# Patient Record
Sex: Male | Born: 1950 | Hispanic: No | Smoking: Current every day smoker
Health system: Southern US, Community
[De-identification: ages and names within clinical notes are randomized; demographics above are authoritative.]

## PROBLEM LIST (undated history)

## (undated) DIAGNOSIS — E781 Pure hyperglyceridemia: Secondary | ICD-10-CM

## (undated) DIAGNOSIS — G4700 Insomnia, unspecified: Secondary | ICD-10-CM

## (undated) DIAGNOSIS — E119 Type 2 diabetes mellitus without complications: Secondary | ICD-10-CM

## (undated) DIAGNOSIS — F1210 Cannabis abuse, uncomplicated: Secondary | ICD-10-CM

## (undated) DIAGNOSIS — F319 Bipolar disorder, unspecified: Secondary | ICD-10-CM

## (undated) DIAGNOSIS — F4310 Post-traumatic stress disorder, unspecified: Secondary | ICD-10-CM

## (undated) DIAGNOSIS — I10 Essential (primary) hypertension: Secondary | ICD-10-CM

## (undated) DIAGNOSIS — G8929 Other chronic pain: Secondary | ICD-10-CM

## (undated) DIAGNOSIS — IMO0002 Reserved for concepts with insufficient information to code with codable children: Secondary | ICD-10-CM

## (undated) DIAGNOSIS — F1110 Opioid abuse, uncomplicated: Secondary | ICD-10-CM

## (undated) HISTORY — PX: FOOT SURGERY: SHX648

---

## 2005-09-23 ENCOUNTER — Emergency Department (HOSPITAL_COMMUNITY): Admission: EM | Admit: 2005-09-23 | Discharge: 2005-09-23 | Payer: Self-pay | Admitting: Emergency Medicine

## 2006-01-22 ENCOUNTER — Ambulatory Visit: Payer: Self-pay | Admitting: Internal Medicine

## 2013-09-04 ENCOUNTER — Emergency Department (HOSPITAL_COMMUNITY)
Admission: EM | Admit: 2013-09-04 | Discharge: 2013-09-04 | Disposition: A | Discharge status: Home / Self Care | Payer: MEDICAID | Attending: Emergency Medicine | Admitting: Emergency Medicine

## 2013-09-04 ENCOUNTER — Encounter (HOSPITAL_COMMUNITY): Payer: Self-pay | Admitting: Emergency Medicine

## 2013-09-04 DIAGNOSIS — E119 Type 2 diabetes mellitus without complications: Secondary | ICD-10-CM | POA: Insufficient documentation

## 2013-09-04 DIAGNOSIS — F17200 Nicotine dependence, unspecified, uncomplicated: Secondary | ICD-10-CM | POA: Diagnosis not present

## 2013-09-04 DIAGNOSIS — R109 Unspecified abdominal pain: Secondary | ICD-10-CM | POA: Diagnosis not present

## 2013-09-04 DIAGNOSIS — R197 Diarrhea, unspecified: Secondary | ICD-10-CM | POA: Diagnosis not present

## 2013-09-04 DIAGNOSIS — I10 Essential (primary) hypertension: Secondary | ICD-10-CM | POA: Insufficient documentation

## 2013-09-04 DIAGNOSIS — R112 Nausea with vomiting, unspecified: Secondary | ICD-10-CM | POA: Insufficient documentation

## 2013-09-04 DIAGNOSIS — F1110 Opioid abuse, uncomplicated: Secondary | ICD-10-CM | POA: Diagnosis not present

## 2013-09-04 DIAGNOSIS — F19939 Other psychoactive substance use, unspecified with withdrawal, unspecified: Principal | ICD-10-CM | POA: Insufficient documentation

## 2013-09-04 DIAGNOSIS — F1123 Opioid dependence with withdrawal: Secondary | ICD-10-CM

## 2013-09-04 DIAGNOSIS — F1193 Opioid use, unspecified with withdrawal: Secondary | ICD-10-CM

## 2013-09-04 HISTORY — DX: Type 2 diabetes mellitus without complications: E11.9

## 2013-09-04 HISTORY — DX: Essential (primary) hypertension: I10

## 2013-09-04 LAB — BASIC METABOLIC PANEL
ANION GAP: 13 (ref 5–15)
BUN: 7 mg/dL (ref 6–23)
CO2: 27 meq/L (ref 19–32)
Calcium: 9.2 mg/dL (ref 8.4–10.5)
Chloride: 100 mEq/L (ref 96–112)
Creatinine, Ser: 0.67 mg/dL (ref 0.50–1.35)
GFR calc Af Amer: >90 mL/min (ref >90)
Glucose, Bld: 143 mg/dL — ABNORMAL HIGH (ref 70–99)
POTASSIUM: 3.7 meq/L (ref 3.7–5.3)
SODIUM: 140 meq/L (ref 137–147)

## 2013-09-04 LAB — CBC
HEMATOCRIT: 43.5 % (ref 39.0–52.0)
Hemoglobin: 14.3 g/dL (ref 13.0–17.0)
MCH: 30.6 pg (ref 26.0–34.0)
MCHC: 32.9 g/dL (ref 30.0–36.0)
MCV: 92.9 fL (ref 78.0–100.0)
PLATELETS: 257 10*3/uL (ref 150–400)
RBC: 4.68 MIL/uL (ref 4.22–5.81)
RDW: 12.6 % (ref 11.5–15.5)
WBC: 8.4 10*3/uL (ref 4.0–10.5)

## 2013-09-04 MED ORDER — CLONIDINE HCL 0.1 MG PO TABS: 0.1000 mg | ORAL_TABLET | Freq: Three times a day (TID) | ORAL | Status: DC

## 2013-09-04 MED ORDER — LOPERAMIDE HCL 2 MG PO CAPS: 2.0000 mg | ORAL_CAPSULE | Freq: Four times a day (QID) | ORAL | Status: DC | PRN

## 2013-09-04 MED ORDER — HYDROXYZINE HCL 25 MG PO TABS
25.0000 mg | ORAL_TABLET | Freq: Four times a day (QID) | ORAL | Status: DC | PRN
  Administered 2013-09-04: 25 mg via ORAL
  Filled 2013-09-04: qty 1

## 2013-09-04 MED ORDER — CLONIDINE HCL 0.1 MG PO TABS: 0.1000 mg | ORAL_TABLET | ORAL | Status: DC

## 2013-09-04 MED ORDER — METHOCARBAMOL 500 MG PO TABS: 1000.0000 mg | ORAL_TABLET | Freq: Four times a day (QID) | ORAL | Status: DC | PRN

## 2013-09-04 MED ORDER — DICYCLOMINE HCL 20 MG PO TABS: 20.0000 mg | ORAL_TABLET | Freq: Four times a day (QID) | ORAL | Status: DC | PRN

## 2013-09-04 MED ORDER — NAPROXEN 250 MG PO TABS
500.0000 mg | ORAL_TABLET | Freq: Two times a day (BID) | ORAL | Status: DC | PRN
Start: 2013-09-04 — End: 2013-09-04
  Administered 2013-09-04: 500 mg via ORAL
  Filled 2013-09-04: qty 2

## 2013-09-04 MED ORDER — ONDANSETRON 4 MG PO TBDP
4.0000 mg | ORAL_TABLET | Freq: Four times a day (QID) | ORAL | Status: DC | PRN
  Administered 2013-09-04: 4 mg via ORAL
  Filled 2013-09-04: qty 1

## 2013-09-04 MED ORDER — CLONIDINE HCL 0.1 MG PO TABS
0.1000 mg | ORAL_TABLET | Freq: Four times a day (QID) | ORAL | Status: DC
  Administered 2013-09-04 (×2): 0.1 mg via ORAL
  Filled 2013-09-04 (×2): qty 1

## 2013-09-04 MED ORDER — CLONIDINE HCL 0.1 MG PO TABS: 0.1000 mg | ORAL_TABLET | Freq: Every day | ORAL | Status: DC

## 2013-09-04 MED ORDER — HYDROXYZINE HCL 25 MG PO TABS: 25.0000 mg | ORAL_TABLET | Freq: Four times a day (QID) | ORAL | Status: DC | PRN

## 2013-09-04 MED ORDER — LOPERAMIDE HCL 2 MG PO CAPS: 2.0000 mg | ORAL_CAPSULE | ORAL | Status: DC | PRN

## 2013-09-04 MED ORDER — METHOCARBAMOL 500 MG PO TABS
500.0000 mg | ORAL_TABLET | Freq: Three times a day (TID) | ORAL | Status: DC | PRN
Start: 2013-09-04 — End: 2013-09-04
  Administered 2013-09-04: 500 mg via ORAL
  Filled 2013-09-04: qty 1

## 2013-09-04 NOTE — ED Provider Notes (Signed)
CSN: [id number]     Arrival date & time 09/04/13  1006 History   First MD Initiated Contact with Patient 09/04/13 1016     Chief Complaint  Patient presents with  . Withdrawal     (Consider location/radiation/quality/duration/timing/severity/associated sxs/prior Treatment) HPI Comments: Patient with history of heroin use, marijuana use, diabetes -- presents with complaint of withdrawal. Patient uses heroin twice a day. Yesterday he decided to stop because he wants to quit. Patient presents today with withdrawals. He complains of abdominal cramping, vomiting, diarrhea. No treatments prior to arrival. Patient denies fevers, chills, cold symptoms, chest pain, shortness of breath. No urinary symptoms or skin rash. Patient states that he has not been checking his blood sugars. The onset of this condition was acute. The course is constant. Aggravating factors: none. Alleviating factors: none.    The history is provided by the patient.    Past Medical History  Diagnosis Date  . Diabetes mellitus without complication   . Hypertension    Past Surgical History  Procedure Laterality Date  . Foot surgery     History reviewed. No pertinent family history. History  Substance Use Topics  . Smoking status: Current Every Day Smoker -- 1.00 packs/day  . Smokeless tobacco: Not on file  . Alcohol Use: No    Review of Systems  Constitutional: Negative for fever.  HENT: Negative for rhinorrhea and sore throat.   Eyes: Negative for redness.  Respiratory: Negative for cough.   Cardiovascular: Negative for chest pain and leg swelling.  Gastrointestinal: Positive for nausea, vomiting, abdominal pain and diarrhea. Negative for constipation.  Genitourinary: Negative for dysuria.  Musculoskeletal: Negative for myalgias.  Skin: Negative for rash.  Neurological: Negative for headaches.      Allergies  Review of patient's allergies indicates no known allergies.  Home Medications   Prior to  Admission medications   Not on File   BP 194/84  Pulse 79  Temp(Src) 97.9 F (36.6 C) (Oral)  Resp 24  SpO2 100%  Physical Exam  Nursing note and vitals reviewed. Constitutional: He appears well-developed and well-nourished.  HENT:  Head: Normocephalic and atraumatic.  Eyes: Conjunctivae are normal. Right eye exhibits no discharge. Left eye exhibits no discharge.  Neck: Normal range of motion. Neck supple.  Cardiovascular: Normal rate, regular rhythm and normal heart sounds.   No murmur heard. Pulmonary/Chest: Effort normal and breath sounds normal. No respiratory distress. He has no wheezes. He has no rales.  Abdominal: Soft. There is tenderness (generalized). There is no rebound and no guarding.  Neurological: He is alert.  Skin: Skin is warm and dry.  Psychiatric: He has a normal mood and affect.    ED Course  Procedures (including critical care time) Labs Review Labs Reviewed  BASIC METABOLIC PANEL - Abnormal; Notable for the following:    Glucose, Bld 143 (*)    All other components within normal limits  CBC    Imaging Review No results found.   EKG Interpretation None      10:24 AM Patient seen and examined. Work-up initiated. Medications ordered.   Vital signs reviewed and are as follows: BP 194/84  Pulse 79  Temp(Src) 97.9 F (36.6 C) (Oral)  Resp 24  SpO2 100%  11:20 AM Spoke with TTS who has faxed over resources.   1:51 PM Patient stable. Labs unconcerning for complications related to DM. Will d/c to home with resources and medication for symptoms. Patient verbalizes understanding and agrees with plan.   BP 169/81  Pulse 71  Temp(Src) 97.9 F (36.6 C) (Oral)  Resp 20  SpO2 96%   MDM   Final diagnoses:  Opiate withdrawal   Pt with signs and symptoms consistent with opiate withdrawal. Patient treated with symptomatic management while in the ED. I spoke with TTS who has faxed resources and I have given these to the patient. No  life-threatening emergencies identified. No complications of diabetes including DKA.    Renne Crigler, PA-C 09/04/13 1353

## 2013-09-04 NOTE — Discharge Instructions (Signed)
Please read and follow all provided instructions.  Your diagnoses today include:  1. Opiate withdrawal     Tests performed today include:  Blood counts and electrolytes - no concerning findings  Vital signs. See below for your results today.   Medications prescribed:   Robaxin (methocarbamol) - muscle relaxer medication  DO NOT drive or perform any activities that require you to be awake and alert because this medicine can make you drowsy.    Hydroxyzine - antihistamine  You can find this medication over-the-counter.   This medication will make you drowsy. DO NOT drive or perform any activities that require you to be awake and alert if taking this.  Take any prescribed medications only as directed.  Home care instructions:  Follow any educational materials contained in this packet.  Follow-up instructions: Contact the referrals given to you for help with heroin detox.   Return instructions:   Please return to the Emergency Department if you experience worsening symptoms.   Please return if you have any other emergent concerns.  Additional Information:  Your vital signs today were: BP 179/74   Pulse 74   Temp(Src) 97.9 F (36.6 C) (Oral)   Resp 20   SpO2 98% If your blood pressure (BP) was elevated above 135/85 this visit, please have this repeated by your doctor within one month. --------------

## 2013-09-04 NOTE — ED Notes (Signed)
Per [facility], pt is IV heroin user, he generally uses twice per day but has not used since yesterday morning. Pt has been feeling "sick" pt reports n/v and multiple bowel movements over night. Hypertensive at 190/100 with EMS No acute distress noted on arrival.

## 2013-09-04 NOTE — ED Provider Notes (Signed)
Medical screening examination/treatment/procedure(s) were performed by non-physician practitioner and as supervising physician I was immediately available for consultation/collaboration.   EKG Interpretation None        Layla MawKristen N Dailey Alberson, DO 09/04/13 1528

## 2013-09-13 ENCOUNTER — Emergency Department (HOSPITAL_COMMUNITY): Payer: MEDICAID

## 2013-09-13 ENCOUNTER — Emergency Department (HOSPITAL_COMMUNITY)
Admission: EM | Admit: 2013-09-13 | Discharge: 2013-09-14 | Disposition: A | Discharge status: Other Acute Inpatient Hospital | Payer: MEDICAID | Attending: Emergency Medicine | Admitting: Emergency Medicine

## 2013-09-13 ENCOUNTER — Encounter (HOSPITAL_COMMUNITY): Payer: Self-pay | Admitting: Emergency Medicine

## 2013-09-13 DIAGNOSIS — I861 Scrotal varices: Secondary | ICD-10-CM

## 2013-09-13 DIAGNOSIS — Z794 Long term (current) use of insulin: Secondary | ICD-10-CM | POA: Diagnosis not present

## 2013-09-13 DIAGNOSIS — F3289 Other specified depressive episodes: Secondary | ICD-10-CM | POA: Diagnosis not present

## 2013-09-13 DIAGNOSIS — R197 Diarrhea, unspecified: Secondary | ICD-10-CM | POA: Diagnosis not present

## 2013-09-13 DIAGNOSIS — Z008 Encounter for other general examination: Secondary | ICD-10-CM | POA: Insufficient documentation

## 2013-09-13 DIAGNOSIS — R079 Chest pain, unspecified: Secondary | ICD-10-CM | POA: Diagnosis not present

## 2013-09-13 DIAGNOSIS — R109 Unspecified abdominal pain: Secondary | ICD-10-CM | POA: Insufficient documentation

## 2013-09-13 DIAGNOSIS — E119 Type 2 diabetes mellitus without complications: Secondary | ICD-10-CM | POA: Diagnosis not present

## 2013-09-13 DIAGNOSIS — F1120 Opioid dependence, uncomplicated: Secondary | ICD-10-CM | POA: Diagnosis not present

## 2013-09-13 DIAGNOSIS — F1110 Opioid abuse, uncomplicated: Secondary | ICD-10-CM

## 2013-09-13 DIAGNOSIS — G319 Degenerative disease of nervous system, unspecified: Secondary | ICD-10-CM | POA: Insufficient documentation

## 2013-09-13 DIAGNOSIS — F4310 Post-traumatic stress disorder, unspecified: Secondary | ICD-10-CM | POA: Diagnosis not present

## 2013-09-13 DIAGNOSIS — Z79899 Other long term (current) drug therapy: Secondary | ICD-10-CM | POA: Diagnosis not present

## 2013-09-13 DIAGNOSIS — F329 Major depressive disorder, single episode, unspecified: Secondary | ICD-10-CM | POA: Diagnosis not present

## 2013-09-13 DIAGNOSIS — I10 Essential (primary) hypertension: Secondary | ICD-10-CM

## 2013-09-13 DIAGNOSIS — F17200 Nicotine dependence, unspecified, uncomplicated: Secondary | ICD-10-CM | POA: Insufficient documentation

## 2013-09-13 DIAGNOSIS — R739 Hyperglycemia, unspecified: Secondary | ICD-10-CM

## 2013-09-13 HISTORY — DX: Cannabis abuse, uncomplicated: F12.10

## 2013-09-13 HISTORY — DX: Opioid abuse, uncomplicated: F11.10

## 2013-09-13 HISTORY — DX: Insomnia, unspecified: G47.00

## 2013-09-13 HISTORY — DX: Bipolar disorder, unspecified: F31.9

## 2013-09-13 HISTORY — DX: Reserved for concepts with insufficient information to code with codable children: IMO0002

## 2013-09-13 HISTORY — DX: Pure hyperglyceridemia: E78.1

## 2013-09-13 HISTORY — DX: Other chronic pain: G89.29

## 2013-09-13 HISTORY — DX: Post-traumatic stress disorder, unspecified: F43.10

## 2013-09-13 LAB — COMPREHENSIVE METABOLIC PANEL
ALBUMIN: 3.7 g/dL (ref 3.5–5.2)
ALT: 20 U/L (ref 0–53)
AST: 24 U/L (ref 0–37)
Alkaline Phosphatase: 59 U/L (ref 39–117)
Anion gap: 11 (ref 5–15)
BILIRUBIN TOTAL: 0.7 mg/dL (ref 0.3–1.2)
BUN: 6 mg/dL (ref 6–23)
CO2: 28 meq/L (ref 19–32)
CREATININE: 0.69 mg/dL (ref 0.50–1.35)
Calcium: 9.3 mg/dL (ref 8.4–10.5)
Chloride: 98 mEq/L (ref 96–112)
GFR calc Af Amer: >90 mL/min (ref >90)
GFR calc non Af Amer: >90 mL/min (ref >90)
Glucose, Bld: 148 mg/dL — ABNORMAL HIGH (ref 70–99)
Potassium: 3.4 mEq/L — ABNORMAL LOW (ref 3.7–5.3)
Sodium: 137 mEq/L (ref 137–147)
Total Protein: 7.1 g/dL (ref 6.0–8.3)

## 2013-09-13 LAB — I-STAT TROPONIN, ED
TROPONIN I, POC: 0.05 ng/mL (ref 0.00–0.08)
Troponin i, poc: 0.01 ng/mL (ref 0.00–0.08)

## 2013-09-13 LAB — CBC WITH DIFFERENTIAL/PLATELET
BASOS PCT: 0 % (ref 0–1)
Basophils Absolute: 0 10*3/uL (ref 0.0–0.1)
Eosinophils Absolute: 0.2 10*3/uL (ref 0.0–0.7)
Eosinophils Relative: 2 % (ref 0–5)
HEMATOCRIT: 41.7 % (ref 39.0–52.0)
HEMOGLOBIN: 13.9 g/dL (ref 13.0–17.0)
Lymphocytes Relative: 41 % (ref 12–46)
Lymphs Abs: 3.5 10*3/uL (ref 0.7–4.0)
MCH: 30.6 pg (ref 26.0–34.0)
MCHC: 33.3 g/dL (ref 30.0–36.0)
MCV: 91.9 fL (ref 78.0–100.0)
MONO ABS: 0.4 10*3/uL (ref 0.1–1.0)
MONOS PCT: 5 % (ref 3–12)
Neutro Abs: 4.3 10*3/uL (ref 1.7–7.7)
Neutrophils Relative %: 52 % (ref 43–77)
Platelets: 278 10*3/uL (ref 150–400)
RBC: 4.54 MIL/uL (ref 4.22–5.81)
RDW: 12.3 % (ref 11.5–15.5)
WBC: 8.4 10*3/uL (ref 4.0–10.5)

## 2013-09-13 LAB — RAPID URINE DRUG SCREEN, HOSP PERFORMED
Amphetamines: NOT DETECTED
BARBITURATES: NOT DETECTED
Benzodiazepines: NOT DETECTED
Cocaine: NOT DETECTED
Opiates: POSITIVE — AB
Tetrahydrocannabinol: POSITIVE — AB

## 2013-09-13 LAB — CBG MONITORING, ED: GLUCOSE-CAPILLARY: 125 mg/dL — AB (ref 70–99)

## 2013-09-13 LAB — ETHANOL

## 2013-09-13 MED ORDER — LORAZEPAM 1 MG PO TABS
1.0000 mg | ORAL_TABLET | Freq: Three times a day (TID) | ORAL | Status: DC | PRN
  Administered 2013-09-13: 1 mg via ORAL
  Filled 2013-09-13: qty 1

## 2013-09-13 MED ORDER — IBUPROFEN 400 MG PO TABS: 600.0000 mg | ORAL_TABLET | Freq: Three times a day (TID) | ORAL | Status: DC | PRN

## 2013-09-13 MED ORDER — NAPROXEN 250 MG PO TABS: 500.0000 mg | ORAL_TABLET | Freq: Two times a day (BID) | ORAL | Status: DC | PRN

## 2013-09-13 MED ORDER — CLONIDINE HCL 0.1 MG PO TABS: 0.1000 mg | ORAL_TABLET | ORAL | Status: DC

## 2013-09-13 MED ORDER — CLONIDINE HCL 0.1 MG PO TABS
0.1000 mg | ORAL_TABLET | Freq: Four times a day (QID) | ORAL | Status: DC
  Administered 2013-09-13 – 2013-09-14 (×6): 0.1 mg via ORAL
  Filled 2013-09-13 (×6): qty 1

## 2013-09-13 MED ORDER — DICYCLOMINE HCL 20 MG PO TABS
20.0000 mg | ORAL_TABLET | Freq: Four times a day (QID) | ORAL | Status: DC | PRN
  Administered 2013-09-13 – 2013-09-14 (×3): 20 mg via ORAL
  Filled 2013-09-13 (×3): qty 1

## 2013-09-13 MED ORDER — HYDROXYZINE HCL 25 MG PO TABS
25.0000 mg | ORAL_TABLET | Freq: Four times a day (QID) | ORAL | Status: DC | PRN
  Administered 2013-09-13 – 2013-09-14 (×2): 25 mg via ORAL
  Filled 2013-09-13 (×2): qty 1

## 2013-09-13 MED ORDER — CLONIDINE HCL 0.1 MG PO TABS: 0.1000 mg | ORAL_TABLET | Freq: Every day | ORAL | Status: DC

## 2013-09-13 MED ORDER — ONDANSETRON HCL 4 MG PO TABS: 4.0000 mg | ORAL_TABLET | Freq: Three times a day (TID) | ORAL | Status: DC | PRN

## 2013-09-13 MED ORDER — LOPERAMIDE HCL 2 MG PO CAPS
2.0000 mg | ORAL_CAPSULE | ORAL | Status: DC | PRN
  Administered 2013-09-14: 2 mg via ORAL
  Filled 2013-09-13: qty 1

## 2013-09-13 MED ORDER — METHOCARBAMOL 500 MG PO TABS
500.0000 mg | ORAL_TABLET | Freq: Three times a day (TID) | ORAL | Status: DC | PRN
  Administered 2013-09-13: 500 mg via ORAL
  Filled 2013-09-13: qty 1

## 2013-09-13 MED ORDER — ONDANSETRON 4 MG PO TBDP
4.0000 mg | ORAL_TABLET | Freq: Four times a day (QID) | ORAL | Status: DC | PRN
  Filled 2013-09-13: qty 1

## 2013-09-13 MED ORDER — ACETAMINOPHEN 325 MG PO TABS
650.0000 mg | ORAL_TABLET | ORAL | Status: DC | PRN
  Administered 2013-09-13: 650 mg via ORAL
  Filled 2013-09-13: qty 2

## 2013-09-13 MED ORDER — ALUM & MAG HYDROXIDE-SIMETH 200-200-20 MG/5ML PO SUSP: 30.0000 mL | ORAL | Status: DC | PRN

## 2013-09-13 MED ORDER — NICOTINE 21 MG/24HR TD PT24
21.0000 mg | MEDICATED_PATCH | Freq: Every day | TRANSDERMAL | Status: DC
Start: 2013-09-13 — End: 2013-09-14
  Administered 2013-09-13 – 2013-09-14 (×2): 21 mg via TRANSDERMAL
  Filled 2013-09-13 (×3): qty 1

## 2013-09-13 NOTE — ED Notes (Signed)
PA at bedside.

## 2013-09-13 NOTE — ED Provider Notes (Signed)
Testicular US shows varicocele Awaiting placement   Henry Gaskinsonald W Ketty Bitton, MD 09/13/13 [id number]
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
## 2013-09-13 NOTE — Progress Notes (Signed)
[place] the patient would have to pay for hospitalization out of pocket because he has adult medicaid.

## 2013-09-13 NOTE — ED Notes (Signed)
Patient arrived via [facility] with heroin withdrawal. Patient stopped using heroin 2 days ago and is currently having left sided chest pain, generalized weakness, shortness of breath. No EKG or IV prior to arrival. Patient is hypertensive and states he has not had his BP medication in about a week. A/O at this time.

## 2013-09-13 NOTE — Progress Notes (Signed)
[facility]  Writer informed the nurse Renae Fickle(Paul) of the patients disposition.

## 2013-09-13 NOTE — ED Notes (Signed)
Pt. Reported "OOH ,my testicles hurt."    Informed Josh,Geiple, PA , no orders received.

## 2013-09-13 NOTE — ED Notes (Signed)
Pt. Asked to go outside. Explained to pt. That he is unable to go outside.  Offered him a Nicotine patch.  Pt. Refused.  Pt. Denies any alcohol comsumption.  Last time pt. Used heroine was yesterday.

## 2013-09-13 NOTE — ED Notes (Signed)
Pt moved to rm C21-- oriented to rm and area. Sitter at bedside. Pt affect flat, staring at staff, using one word answers.

## 2013-09-13 NOTE — ED Notes (Signed)
Pt.  Changed in Scrubs and  Will be wanded at the bedside in Pod C 20.  Belongings given to Clydie BraunKaren, RCharity fundraiser

## 2013-09-13 NOTE — ED Provider Notes (Signed)
CSN: [id number]     Arrival date & time 09/13/13  0555 History   First MD Initiated Contact with Patient 09/13/13 0600     Chief Complaint  Patient presents with  . Addiction Problem     (Consider location/radiation/quality/duration/timing/severity/associated sxs/prior Treatment) HPI Comments: Patient with h/o DM, marijuana use, heroin use -- presents with c/o withdrawal and chest pain. Last heroin use 2 days ago. Injects into stomach. Denies EtOH and cocaine. Intermittent, non-radiating L lower CP x 2 days. No SOB. Also c/o abdominal pain, N/V/D. No fever, urinary sx, skin rashes or swelling. No treatments PTA. States he followed-up with referrals given last time and states no one could help him because he is a [employer]ident. He has not had chronic medications in past week. The onset of this condition was acute. The course is constant. Aggravating factors: none. Alleviating factors: none.    The history is provided by the patient and medical records.    Past Medical History  Diagnosis Date  . Diabetes mellitus without complication   . Hypertension   . PTSD (post-traumatic stress disorder)    Past Surgical History  Procedure Laterality Date  . Foot surgery     No family history on file. History  Substance Use Topics  . Smoking status: Current Every Day Smoker -- 1.00 packs/day  . Smokeless tobacco: Not on file  . Alcohol Use: No    Review of Systems  Constitutional: Negative for fever.  HENT: Negative for rhinorrhea and sore throat.   Eyes: Negative for redness.  Respiratory: Negative for cough and shortness of breath.   Cardiovascular: Positive for chest pain. Negative for palpitations and leg swelling.  Gastrointestinal: Positive for nausea, vomiting, abdominal pain and diarrhea.  Genitourinary: Negative for dysuria.  Musculoskeletal: Negative for myalgias.  Skin: Negative for rash.  Neurological: Negative for headaches.   Allergies  Review of patient's  allergies indicates no known allergies.  Home Medications   Prior to Admission medications   Medication Sig Start Date End Date Taking? Authorizing Provider  amitriptyline (ELAVIL) 50 MG tablet Take 50 mg by mouth at bedtime.    Historical Provider, MD  cloNIDine (CATAPRES) 0.1 MG tablet Take 1 tablet (0.1 mg total) by mouth 3 (three) times daily. 09/04/13   Renne CriglerJoshua Ovida Delagarza, PA-C  hydrOXYzine (ATARAX/VISTARIL) 25 MG tablet Take 1 tablet (25 mg total) by mouth every 6 (six) hours as needed for anxiety. 09/04/13   Renne CriglerJoshua Seanmichael Salmons, PA-C  insulin aspart (NOVOLOG) 100 UNIT/ML injection Inject 8 Units into the skin 3 (three) times daily before meals.    Historical Provider, MD  insulin glargine (LANTUS) 100 UNIT/ML injection Inject 60 Units into the skin at bedtime.    Historical Provider, MD  loperamide (IMODIUM) 2 MG capsule Take 1 capsule (2 mg total) by mouth 4 (four) times daily as needed for diarrhea or loose stools. 09/04/13   Renne CriglerJoshua Fantasy Donald, PA-C  methocarbamol (ROBAXIN) 500 MG tablet Take 2 tablets (1,000 mg total) by mouth every 6 (six) hours as needed for muscle spasms. 09/04/13   Renne CriglerJoshua Kamry Faraci, PA-C  QUEtiapine (SEROQUEL) 300 MG tablet Take 600 mg by mouth at bedtime.    Historical Provider, MD   BP 190/79  Pulse 61  Temp(Src) 98.2 F (36.8 C) (Oral)  Resp 15  Ht 6\' 2"  (1.88 m)  Wt 190 lb (86.183 kg)  BMI 24.38 kg/m2  SpO2 100%  Physical Exam  Nursing note and vitals reviewed. Constitutional: He appears well-developed and well-nourished.  HENT:  Head: Normocephalic and atraumatic.  Eyes: Conjunctivae are normal. Right eye exhibits no discharge. Left eye exhibits no discharge.  Neck: Normal range of motion. Neck supple.  Cardiovascular: Normal rate, regular rhythm and normal heart sounds.   Pulmonary/Chest: Effort normal and breath sounds normal.  Abdominal: Soft. There is no tenderness.  Neurological: He is alert.  Skin: Skin is warm and dry.  Psychiatric: He has a normal mood and  affect.    ED Course  Procedures (including critical care time) Labs Review Labs Reviewed  COMPREHENSIVE METABOLIC PANEL - Abnormal; Notable for the following:    Potassium 3.4 (*)    Glucose, Bld 148 (*)    All other components within normal limits  URINE RAPID DRUG SCREEN (HOSP PERFORMED) - Abnormal; Notable for the following:    Opiates POSITIVE (*)    Tetrahydrocannabinol POSITIVE (*)    All other components within normal limits  CBC WITH DIFFERENTIAL  ETHANOL  I-STAT TROPOININ, ED  Rosezena Sensor, ED    Imaging Review Dg Chest 2 View  09/13/2013   CLINICAL DATA:  Chest pain and difficulty breathing  EXAM: CHEST  2 VIEW  COMPARISON:  None.  FINDINGS: Lungs are clear. Heart size and pulmonary vascularity are normal. No adenopathy. No pneumothorax. No bone lesions.  IMPRESSION: No edema or consolidation.   Electronically Signed   By: Bretta Bang M.D.   On: 09/13/2013 07:01     EKG Interpretation   Date/Time:  Monday September 13 2013 06:11:05 EDT Ventricular Rate:  71 PR Interval:  205 QRS Duration: 105 QT Interval:  478 QTC Calculation: 519 R Axis:   128 Text Interpretation:  Sinus rhythm Ventricular premature complex Consider  left ventricular hypertrophy Inferior infarct, old Prolonged QT interval  Confirmed by Rhunette Croft, MD, Janey Genta [phone]) on 09/13/2013 6:16:30 AM      6:16 AM Patient seen and examined. Work-up initiated. Medications ordered.   Vital signs reviewed and are as follows: BP 190/79  Pulse 61  Temp(Src) 98.2 F (36.8 C) (Oral)  Resp 15  Ht 6\' 2"  (1.88 m)  Wt 190 lb (86.183 kg)  BMI 24.38 kg/m2  SpO2 100%  9:48 AM 2nd marker neg. Pt discussed with Dr. Patria Mane. TTS has evaluated. Feels he needs inpatient treatment. He does endorse depression and suicidality. Will move to Pod C.   2:02 PM Continues to hold while placement is found. Psych requests head CT and CXR to facilitate placement.   3:06 PM Called to check on patient. He is having  testicular tenderness. Will order ultrasound to eval for epididymitis.    MDM   Final diagnoses:  Heroin abuse  Hyperglycemia without ketosis   Pending placement.     Renne Crigler, PA-C 09/13/13 [address], PA-C 09/13/13 [phone]

## 2013-09-13 NOTE — ED Notes (Signed)
Pt. Given coffee.  Pt. Does not want anything to eat presently.   Set up TTS at the bedside.

## 2013-09-14 ENCOUNTER — Encounter (HOSPITAL_COMMUNITY): Payer: Self-pay | Admitting: General Practice

## 2013-09-14 ENCOUNTER — Inpatient Hospital Stay (HOSPITAL_COMMUNITY)
Admission: AD | Admit: 2013-09-14 | Discharge: 2013-09-20 | DRG: 897 | Disposition: A | Discharge status: Home / Self Care | Payer: MEDICAID | Source: Intra-hospital | Attending: Psychiatry | Admitting: Psychiatry

## 2013-09-14 ENCOUNTER — Encounter (HOSPITAL_COMMUNITY): Payer: Self-pay | Admitting: Emergency Medicine

## 2013-09-14 DIAGNOSIS — I10 Essential (primary) hypertension: Secondary | ICD-10-CM | POA: Diagnosis present

## 2013-09-14 DIAGNOSIS — F1210 Cannabis abuse, uncomplicated: Secondary | ICD-10-CM | POA: Diagnosis present

## 2013-09-14 DIAGNOSIS — G8929 Other chronic pain: Secondary | ICD-10-CM | POA: Diagnosis present

## 2013-09-14 DIAGNOSIS — F4310 Post-traumatic stress disorder, unspecified: Secondary | ICD-10-CM | POA: Diagnosis present

## 2013-09-14 DIAGNOSIS — F1120 Opioid dependence, uncomplicated: Secondary | ICD-10-CM | POA: Diagnosis present

## 2013-09-14 DIAGNOSIS — Z598 Other problems related to housing and economic circumstances: Secondary | ICD-10-CM | POA: Diagnosis not present

## 2013-09-14 DIAGNOSIS — G4700 Insomnia, unspecified: Secondary | ICD-10-CM | POA: Diagnosis present

## 2013-09-14 DIAGNOSIS — E119 Type 2 diabetes mellitus without complications: Secondary | ICD-10-CM | POA: Diagnosis present

## 2013-09-14 DIAGNOSIS — F319 Bipolar disorder, unspecified: Secondary | ICD-10-CM | POA: Diagnosis present

## 2013-09-14 DIAGNOSIS — M549 Dorsalgia, unspecified: Secondary | ICD-10-CM | POA: Diagnosis present

## 2013-09-14 DIAGNOSIS — Z5987 Material hardship due to limited financial resources, not elsewhere classified: Secondary | ICD-10-CM

## 2013-09-14 DIAGNOSIS — R45851 Suicidal ideations: Secondary | ICD-10-CM | POA: Diagnosis not present

## 2013-09-14 DIAGNOSIS — IMO0001 Reserved for inherently not codable concepts without codable children: Secondary | ICD-10-CM | POA: Diagnosis present

## 2013-09-14 DIAGNOSIS — F17200 Nicotine dependence, unspecified, uncomplicated: Secondary | ICD-10-CM | POA: Diagnosis present

## 2013-09-14 DIAGNOSIS — F411 Generalized anxiety disorder: Secondary | ICD-10-CM | POA: Diagnosis present

## 2013-09-14 DIAGNOSIS — F1994 Other psychoactive substance use, unspecified with psychoactive substance-induced mood disorder: Secondary | ICD-10-CM | POA: Diagnosis present

## 2013-09-14 DIAGNOSIS — Z5989 Other problems related to housing and economic circumstances: Secondary | ICD-10-CM | POA: Diagnosis not present

## 2013-09-14 DIAGNOSIS — F329 Major depressive disorder, single episode, unspecified: Secondary | ICD-10-CM | POA: Diagnosis present

## 2013-09-14 DIAGNOSIS — F1124 Opioid dependence with opioid-induced mood disorder: Secondary | ICD-10-CM

## 2013-09-14 LAB — GLUCOSE, CAPILLARY
GLUCOSE-CAPILLARY: 113 mg/dL — AB (ref 70–99)
Glucose-Capillary: 174 mg/dL — ABNORMAL HIGH (ref 70–99)

## 2013-09-14 LAB — CBG MONITORING, ED
GLUCOSE-CAPILLARY: 156 mg/dL — AB (ref 70–99)
Glucose-Capillary: 122 mg/dL — ABNORMAL HIGH (ref 70–99)

## 2013-09-14 MED ORDER — METHOCARBAMOL 500 MG PO TABS
500.0000 mg | ORAL_TABLET | Freq: Three times a day (TID) | ORAL | Status: AC | PRN
  Administered 2013-09-14 – 2013-09-18 (×6): 500 mg via ORAL
  Filled 2013-09-14 (×6): qty 1

## 2013-09-14 MED ORDER — LOPERAMIDE HCL 2 MG PO CAPS: 2.0000 mg | ORAL_CAPSULE | ORAL | Status: AC | PRN

## 2013-09-14 MED ORDER — ALUM & MAG HYDROXIDE-SIMETH 200-200-20 MG/5ML PO SUSP: 30.0000 mL | ORAL | Status: DC | PRN

## 2013-09-14 MED ORDER — QUETIAPINE FUMARATE 400 MG PO TABS
400.0000 mg | ORAL_TABLET | Freq: Every day | ORAL | Status: DC
  Administered 2013-09-14: 400 mg via ORAL
  Filled 2013-09-14: qty 1
  Filled 2013-09-14: qty 2
  Filled 2013-09-14: qty 1

## 2013-09-14 MED ORDER — LISINOPRIL-HYDROCHLOROTHIAZIDE 20-25 MG PO TABS: 1.0000 | ORAL_TABLET | Freq: Every day | ORAL | Status: DC

## 2013-09-14 MED ORDER — SERTRALINE HCL 100 MG PO TABS
200.0000 mg | ORAL_TABLET | Freq: Every day | ORAL | Status: DC
  Administered 2013-09-15 – 2013-09-20 (×6): 200 mg via ORAL
  Filled 2013-09-14: qty 14
  Filled 2013-09-14 (×6): qty 2
  Filled 2013-09-14: qty 4
  Filled 2013-09-14: qty 2

## 2013-09-14 MED ORDER — LISINOPRIL 20 MG PO TABS
20.0000 mg | ORAL_TABLET | Freq: Once | ORAL | Status: AC
  Administered 2013-09-14: 20 mg via ORAL
  Filled 2013-09-14: qty 1

## 2013-09-14 MED ORDER — CLONIDINE HCL 0.1 MG PO TABS
0.1000 mg | ORAL_TABLET | ORAL | Status: AC
  Administered 2013-09-17 – 2013-09-18 (×4): 0.1 mg via ORAL
  Filled 2013-09-14 (×4): qty 1

## 2013-09-14 MED ORDER — NAPROXEN 500 MG PO TABS
500.0000 mg | ORAL_TABLET | Freq: Two times a day (BID) | ORAL | Status: AC | PRN
  Administered 2013-09-15 – 2013-09-18 (×3): 500 mg via ORAL
  Filled 2013-09-14 (×3): qty 1

## 2013-09-14 MED ORDER — MAGNESIUM HYDROXIDE 400 MG/5ML PO SUSP: 30.0000 mL | Freq: Every day | ORAL | Status: DC | PRN

## 2013-09-14 MED ORDER — HYDROCHLOROTHIAZIDE 25 MG PO TABS
25.0000 mg | ORAL_TABLET | Freq: Every day | ORAL | Status: DC
  Administered 2013-09-15 – 2013-09-20 (×6): 25 mg via ORAL
  Filled 2013-09-14 (×9): qty 1

## 2013-09-14 MED ORDER — ACETAMINOPHEN 325 MG PO TABS
650.0000 mg | ORAL_TABLET | Freq: Four times a day (QID) | ORAL | Status: DC | PRN
  Administered 2013-09-17 – 2013-09-20 (×2): 650 mg via ORAL
  Filled 2013-09-14 (×2): qty 2

## 2013-09-14 MED ORDER — DICYCLOMINE HCL 20 MG PO TABS
20.0000 mg | ORAL_TABLET | Freq: Four times a day (QID) | ORAL | Status: AC | PRN
  Administered 2013-09-14 – 2013-09-15 (×2): 20 mg via ORAL
  Filled 2013-09-14 (×2): qty 1

## 2013-09-14 MED ORDER — QUETIAPINE FUMARATE 200 MG PO TABS: 600.0000 mg | ORAL_TABLET | Freq: Every day | ORAL | Status: DC

## 2013-09-14 MED ORDER — INSULIN ASPART 100 UNIT/ML ~~LOC~~ SOLN
5.0000 [IU] | Freq: Three times a day (TID) | SUBCUTANEOUS | Status: DC
  Administered 2013-09-14 – 2013-09-17 (×5): 5 [IU] via SUBCUTANEOUS

## 2013-09-14 MED ORDER — CLONIDINE HCL 0.1 MG PO TABS
0.1000 mg | ORAL_TABLET | Freq: Four times a day (QID) | ORAL | Status: AC
  Administered 2013-09-14 – 2013-09-16 (×10): 0.1 mg via ORAL
  Filled 2013-09-14 (×12): qty 1

## 2013-09-14 MED ORDER — AMITRIPTYLINE HCL 25 MG PO TABS: 50.0000 mg | ORAL_TABLET | Freq: Every day | ORAL | Status: DC

## 2013-09-14 MED ORDER — HYDROXYZINE HCL 25 MG PO TABS
25.0000 mg | ORAL_TABLET | Freq: Four times a day (QID) | ORAL | Status: AC | PRN
  Administered 2013-09-14 – 2013-09-15 (×2): 25 mg via ORAL
  Filled 2013-09-14 (×2): qty 1

## 2013-09-14 MED ORDER — HYDROCHLOROTHIAZIDE 12.5 MG PO CAPS
12.5000 mg | ORAL_CAPSULE | Freq: Every day | ORAL | Status: DC
  Administered 2013-09-14: 12.5 mg via ORAL
  Filled 2013-09-14: qty 1

## 2013-09-14 MED ORDER — CLONIDINE HCL 0.1 MG PO TABS
0.1000 mg | ORAL_TABLET | Freq: Every day | ORAL | Status: AC
  Administered 2013-09-19 – 2013-09-20 (×2): 0.1 mg via ORAL
  Filled 2013-09-14 (×2): qty 1

## 2013-09-14 MED ORDER — NICOTINE 21 MG/24HR TD PT24
21.0000 mg | MEDICATED_PATCH | Freq: Every day | TRANSDERMAL | Status: DC
  Administered 2013-09-16 – 2013-09-19 (×4): 21 mg via TRANSDERMAL
  Filled 2013-09-14 (×8): qty 1

## 2013-09-14 MED ORDER — SERTRALINE HCL 50 MG PO TABS
200.0000 mg | ORAL_TABLET | Freq: Every day | ORAL | Status: DC
  Administered 2013-09-14: 200 mg via ORAL
  Filled 2013-09-14: qty 4

## 2013-09-14 MED ORDER — LISINOPRIL 20 MG PO TABS
20.0000 mg | ORAL_TABLET | Freq: Every day | ORAL | Status: DC
  Administered 2013-09-15 – 2013-09-20 (×6): 20 mg via ORAL
  Filled 2013-09-14 (×4): qty 1
  Filled 2013-09-14: qty 7
  Filled 2013-09-14 (×3): qty 1

## 2013-09-14 MED ORDER — INSULIN GLARGINE 100 UNIT/ML ~~LOC~~ SOLN
65.0000 [IU] | Freq: Every day | SUBCUTANEOUS | Status: DC
  Administered 2013-09-14 – 2013-09-15 (×2): 65 [IU] via SUBCUTANEOUS

## 2013-09-14 MED ORDER — AMITRIPTYLINE HCL 50 MG PO TABS
50.0000 mg | ORAL_TABLET | Freq: Every day | ORAL | Status: DC
  Administered 2013-09-14: 50 mg via ORAL
  Filled 2013-09-14 (×2): qty 1
  Filled 2013-09-14: qty 2

## 2013-09-14 MED ORDER — ONDANSETRON 4 MG PO TBDP: 4.0000 mg | ORAL_TABLET | Freq: Four times a day (QID) | ORAL | Status: AC | PRN

## 2013-09-14 NOTE — Tx Team (Signed)
Initial Interdisciplinary Treatment Plan   PATIENT STRESSORS: Substance abuse   PROBLEM LIST: Problem List/Patient Goals Date to be addressed Date deferred Reason deferred Estimated date of resolution  Substance Abuse  09/14/2013           Depression 09/14/2013                                          DISCHARGE CRITERIA:  Motivation to continue treatment in a less acute level of care Safe-care adequate arrangements made  PRELIMINARY DISCHARGE PLAN: Attend 12-step recovery group Outpatient therapy  PATIENT/FAMIILY INVOLVEMENT: This treatment plan has been presented to and reviewed with the patient, Gypsy BalsamCalvin Glaeser.  The patient and family have been given the opportunity to ask questions and make suggestions.  Rawad Bochicchio E 09/14/2013, 6:00 PM

## 2013-09-14 NOTE — ED Provider Notes (Signed)
Patient in no distress.  He was accepted to behavioral health, and is being transferred.  Gerhard Munchobert Keli Buehner, MD 09/14/13 [phone]

## 2013-09-14 NOTE — ED Notes (Signed)
Spoke with Pt. He. States his previous MD's name was Clement HusbandsJoseph Copper, MD. He states that he was seeing him at [facility] in [place], [place]. He states that around 3 months ago he was D/C'd from their care d/t THC use. He had been treated by that MD for his painful debilitating chronic pain in his spine due to arthritis. He was also treated for DM by him and states that he was prescribed Insulin. Novolog 8 grams which I asked was he sure it was grams or Units and he stated units. He states he also took Lantus 60units twice a day. I asked him if that is how he was able to get needles. He said "yes." I asked him where he gave himself Insulin injections. He stated in his abdomen.  He states that after some time he began getting heroin off the street. I asked him did he get any pain pills too or just the heroin. He stated the heroin. He stated that he had only had it given IV x 2. He otherwise gives it in his stomach just like the insulin. Last dose was 3 days ago per his account. I asked to see his abdomen to see if he had any problems. Abdomen look WNL. No scars or abscesses noted. He told me to feel the the knots under his skin on his lower abdomen. Pt. Did have a few "knots" that were minimal in size and could be felt with palpitation. I asked Pt. If he was on any other meds he stated Seroquel and amitriptyline for depression and PTSD. States it stems from his early years. He did mention that he had a brother that has bipolar d/o and schizophrenia. We ended out conversation at this time.

## 2013-09-14 NOTE — Progress Notes (Signed)
D   Pt appears depressed and anxious   Pt received robaxin but refused ibuprophen as he said it doesn't work for him   Pt reports chronic pain and requesting opiates for same    Pt is isolative and interacts minimally with others A   Verbal support given   Medications administered and effectiveness monitored   Educated on alternative pain solutions   Q 15 min checks R   Pt was not very receptive to alternative pain solutions he said the only thing that works is narcotic pain medications   Pt is safe at present

## 2013-09-14 NOTE — ED Notes (Signed)
Am blood sugar 156

## 2013-09-14 NOTE — ED Provider Notes (Signed)
Medical screening examination/treatment/procedure(s) were conducted as a shared visit with non-physician practitioner(s) and myself.  I personally evaluated the patient during the encounter.   EKG Interpretation   Date/Time:  Monday September 13 2013 06:11:05 EDT Ventricular Rate:  71 PR Interval:  205 QRS Duration: 105 QT Interval:  478 QTC Calculation: 519 R Axis:   128 Text Interpretation:  Sinus rhythm Ventricular premature complex Consider  left ventricular hypertrophy Inferior infarct, old Prolonged QT interval  Confirmed by Rhunette CroftNANAVATI, MD, Janey GentaANKIT [phone]([id number]) on 09/13/2013 6:16:30 AM      Pt comes in w/ complains of heroine withdrawals. Hx of heroine abuse. No alcohol abuse hx. Pt denies nausea, emesis, fevers, chills, chest pains, shortness of breath, headaches, abdominal pain, uti like symptoms. Exam is benign.   Derwood KaplanAnkit Anais Koenen, MD 09/14/13 [phone]

## 2013-09-14 NOTE — ED Notes (Signed)
[facility] [employer]

## 2013-09-14 NOTE — Progress Notes (Signed)
Patient ID: Henry BalsamCalvin Conner, male   DOB: 07/24/1950, 63 y.o.   MRN: [id number]  Henry Conner is a 63 year old african american male [facility] for opiod detox and depression. Patient reports that from time to time he hears voices that whisper at times but are loud other times. Patient currently denies SI/HI and A/V hallucinations but contracts for safety while at the hospital. Patient reports generalized body aches at 7/10. Patient has a past medical history of Diabetes, HTN, and PTSD. Patient reports that he moved to [place] not to long ago. Patient denies Alcohol use. Patient signed a 72 hour request for discharge at 1732 on 09/14/2013. Patient verbalized understanding of the admission process. Q15 minute safety checks were initiated and are maintained. No distress noted at this time.

## 2013-09-14 NOTE — BH Assessment (Signed)
Henry Conner Assessment Progress Note  Spoke to Henry Headonrad Withrow, NP and Henry Heinrichina Tate, RN, [facility]. Henry Conner will hold a bed for pt, but diastolic blood pressure must be below 100. Pt's nurse, Henry Conner, has been notified. She will [facility] after next set of vital signs is taken.   Henry Canninghomas Jhalil Silvera, MA  Triage Specialist  09/14/2013 @ 12:48

## 2013-09-14 NOTE — ED Notes (Signed)
Pt has left with [facility] and sitter to [facility] with all belongings

## 2013-09-15 DIAGNOSIS — F1120 Opioid dependence, uncomplicated: Principal | ICD-10-CM

## 2013-09-15 DIAGNOSIS — F19939 Other psychoactive substance use, unspecified with withdrawal, unspecified: Secondary | ICD-10-CM

## 2013-09-15 DIAGNOSIS — F1994 Other psychoactive substance use, unspecified with psychoactive substance-induced mood disorder: Secondary | ICD-10-CM

## 2013-09-15 LAB — GLUCOSE, CAPILLARY
GLUCOSE-CAPILLARY: 91 mg/dL (ref 70–99)
GLUCOSE-CAPILLARY: 94 mg/dL (ref 70–99)
Glucose-Capillary: 122 mg/dL — ABNORMAL HIGH (ref 70–99)
Glucose-Capillary: 104 mg/dL — ABNORMAL HIGH (ref 70–99)

## 2013-09-15 MED ORDER — QUETIAPINE FUMARATE 200 MG PO TABS
200.0000 mg | ORAL_TABLET | Freq: Every day | ORAL | Status: DC
  Administered 2013-09-15 – 2013-09-16 (×2): 200 mg via ORAL
  Filled 2013-09-15 (×3): qty 1

## 2013-09-15 MED ORDER — AMITRIPTYLINE HCL 25 MG PO TABS
25.0000 mg | ORAL_TABLET | Freq: Every day | ORAL | Status: DC
  Administered 2013-09-15 – 2013-09-19 (×5): 25 mg via ORAL
  Filled 2013-09-15 (×3): qty 1
  Filled 2013-09-15: qty 7
  Filled 2013-09-15 (×3): qty 1
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
## 2013-09-15 NOTE — [facility] Group Notes (Signed)
[facility] LCSW Group Therapy 09/15/2013  1:15 PM Type of Therapy: Group Therapy Participation Level: Active  Participation Quality: Attentive, Sharing and Supportive  Affect: Depressed and Flat  Cognitive: Alert and Oriented  Insight: Developing/Improving and Engaged  Engagement in Therapy: Developing/Improving and Engaged  Modes of Intervention: Clarification, Confrontation, Discussion, Education, Exploration, Limit-setting, Orientation, Problem-solving, Rapport Building, Dance movement psychotherapisteality Testing, Socialization and Support  Summary of Progress/Problems: The topic for group today was emotional regulation. This group focused on both positive and negative emotion identification and allowed group members to process ways to identify feelings, regulate negative emotions, and find healthy ways to manage internal/external emotions. Group members were asked to reflect on a time when their reaction to an emotion led to a negative outcome and explored how alternative responses using emotion regulation would have benefited them. Group members were also asked to discuss a time when emotion regulation was utilized when a negative emotion was experienced. Patient attended part of group but left before group was over and did not share on topic.  Samuella BruinKristin Collins Kerby, MSW, [employer]CSWA Clinical Social Worker [facility] [phone]

## 2013-09-15 NOTE — [facility] Group Notes (Signed)
[facility] After[facility] Discharge Planning Group Note  09/15/2013  10:32 AM  Participation Quality: Did Not Attend - patient observed sleeping in room.  Samuella BruinKristin Sameka Bagent, MSW, [employer]CSWA Clinical Social Worker [facility] [phone]

## 2013-09-15 NOTE — Progress Notes (Signed)
D: Patient appropriate and cooperative with staff. Patient's affect flat, sad and mood depressed. He reported on the self inventory sheet that sleep, appetite, and ability to concentrate are poor and energy level is low. Patient rates depression "7", feelings of hopelessness "5" and anxiety "8". He's attending group sessions and visible in the milieu. Patient is compliant with medication regimen.  A: Support and encouragement provided to patient. Administered scheduled medications per ordering MD. Monitor Q15 minute checks for safety.  R: Patient receptive. Endorses SI, but contracts for safety. Denies HI and AVH. Patient remains safe on the unit.

## 2013-09-15 NOTE — Progress Notes (Signed)
D: Patient in hte hallway on approach.  Patient states he has been in bed most of the day.  Patient states he was not sleeping but just resting.  Patient states he is upset because the doctor decreased the dosages of his medications and does not know how he is going to sleep tonight.  Patient states he continues to have withdrawal symptoms.  Patient denies SI/HI and denies AVH. A: Staff to monitor Q 15 mins for safety.  Encouragement and support offered.  Scheduled medications administered per orders. R: Patient remains safe on the unit.  Patient attended group tonight.  Patient taking administered medications.  Patient visible on the unit.

## 2013-09-15 NOTE — [facility] Group Notes (Signed)
Adult Psychoeducational Group Note  Date:  09/15/2013 Time:  11:48 PM  Group Topic/Focus:  Wrap-Up Group:   The focus of this group is to help patients review their daily goal of treatment and discuss progress on daily workbooks.  Participation Level:  Active  Participation Quality:  Drowsy  Affect:  Flat  Cognitive:  Alert, Appropriate and Oriented  Insight: Improving  Engagement in Group:  Improving  Modes of Intervention:  Discussion and Support  Additional Comments:  Pt stated that today was not a good day for him because he is currently going through withdraws. Two things the pt likes to is fish and play the piano.   Henry SarnaBowman, Henry Conner 09/15/2013, 11:48 PM

## 2013-09-15 NOTE — [facility] Suicide Risk Assessment (Signed)
   Nursing information obtained from:  Patient Demographic factors:  Male Current Mental Status:  NA Loss Factors:  NA Historical Factors:  Family history of mental illness or substance abuse Risk Reduction Factors:  Positive social support Total Time spent with patient: 45 minutes  CLINICAL FACTORS:   Depression:   Anhedonia Alcohol/Substance Abuse/Dependencies  Psychiatric Specialty Exam: Physical Exam  ROS  Blood pressure 152/84, pulse 80, temperature 97.8 F (36.6 C), temperature source Oral, resp. rate 16, height 6\' 2"  (1.88 m), weight 86.183 kg (190 lb), SpO2 100.00%.Body mass index is 24.38 kg/(m^2).  SEE ADMIT NOTE MSE   COGNITIVE FEATURES THAT CONTRIBUTE TO RISK:  Closed-mindedness    SUICIDE RISK:   Moderate:  Frequent suicidal ideation with limited intensity, and duration, some specificity in terms of plans, no associated intent, good self-control, limited dysphoria/symptomatology, some risk factors present, and identifiable protective factors, including available and accessible social support.  PLAN OF CARE:Patient will be admitted to inpatient psychiatric unit for stabilization and safety. Will provide and encourage milieu participation. Provide medication management and maked adjustments as needed.Will also provide medication to address potential WDL symptoms  Will follow daily.    I certify that inpatient services furnished can reasonably be expected to improve the patient's condition.  Adamariz Gillott 09/15/2013, 11:07 AM

## 2013-09-16 DIAGNOSIS — F329 Major depressive disorder, single episode, unspecified: Secondary | ICD-10-CM

## 2013-09-16 DIAGNOSIS — F3289 Other specified depressive episodes: Secondary | ICD-10-CM

## 2013-09-16 LAB — GLUCOSE, CAPILLARY
GLUCOSE-CAPILLARY: 81 mg/dL (ref 70–99)
Glucose-Capillary: 59 mg/dL — ABNORMAL LOW (ref 70–99)
Glucose-Capillary: 95 mg/dL (ref 70–99)
Glucose-Capillary: 131 mg/dL — ABNORMAL HIGH (ref 70–99)
Glucose-Capillary: 83 mg/dL (ref 70–99)

## 2013-09-16 MED ORDER — ENSURE COMPLETE PO LIQD
237.0000 mL | Freq: Two times a day (BID) | ORAL | Status: DC
  Administered 2013-09-17 – 2013-09-19 (×5): 237 mL via ORAL

## 2013-09-16 MED ORDER — INSULIN GLARGINE 100 UNIT/ML ~~LOC~~ SOLN
55.0000 [IU] | Freq: Every day | SUBCUTANEOUS | Status: DC
  Administered 2013-09-16: 55 [IU] via SUBCUTANEOUS

## 2013-09-16 NOTE — Progress Notes (Signed)
D. Pt has been visible in milieu, minimal interaction or participation however. Pt appears depressed and spoke about his situation leading up to his hospitalization. Pt spoke about how he signed his 72 hour request for discharge but feels that he may need to stay a few more days and was instructed that he can rescind his request if he so chooses. Pt did receive medications this evening without incident. A. Support and encouragement provided, medication education given. R. Pt verbalized understanding, safety maintained.

## 2013-09-16 NOTE — Progress Notes (Signed)
Adult Psychoeducational Group Note  Date:  09/16/2013 Time:  10:13 PM  Group Topic/Focus:  Wrap-Up Group:   The focus of this group is to help patients review their daily goal of treatment and discuss progress on daily workbooks.  Participation Level:  Did Not Attend  Participation Quality:  Drowsy  Affect:  Flat  Cognitive:  Confused  Insight: None  Engagement in Group:  Lacking and none  Modes of Intervention:  None  Additional Comments:  None  Taquan Bralley R 09/16/2013, 10:13 PM

## 2013-09-16 NOTE — [facility] Group Notes (Signed)
0900 nursing orientation group   The focus of this group is to educate the patient on the purpose and policies of crisis stabilization and provide a format to answer questions about their admission.  The group details unit policies and expectations of patients while admitted.   Pt was an active participant with minimal interactions.

## 2013-09-16 NOTE — [facility] Group Notes (Signed)
BHH LCSW Group Therapy 09/16/2013 1:15 PM  Type of Therapy: Group Therapy  Participation Level: Active  Participation Quality: Attentive, Sharing and Supportive  Affect: Depressed and Flat  Cognitive: Alert and Oriented  Insight: Developing/Improving and Engaged  Engagement in Therapy: Developing/Improving and Engaged Modes of Intervention: Clarification, Confrontation, Discussion, Education, Exploration, Limit-setting, Orientation, Problem-solving, Rapport Building, Reality Testing, Socialization and Support  Summary of Progress/Problems: Patient actively listened to [employer] guest speaker.   Ceola Para, MSW, LCSWA Clinical Social Worker [facility] [phone]   

## 2013-09-16 NOTE — Progress Notes (Signed)
Inpatient Diabetes Program Recommendations  AACE/ADA: New Consensus Statement on Inpatient Glycemic Control (2013)  Target Ranges:  Prepandial:   less than 140 mg/dL      Peak postprandial:   less than 180 mg/dL (1-2 hours)      Critically ill patients:  140 - 180 mg/dL     Results for Henry Conner, Henry Conner (MRN [id number]) as of 09/16/2013 14:55  Ref. Range 09/15/2013 06:16 09/15/2013 11:33 09/15/2013 16:47 09/15/2013 20:47  Glucose-Capillary Latest Range: 70-99 mg/dL 91 409122 (H) 94 811104 (H)    Results for Henry Conner, Henry Conner (MRN [id number]) as of 09/16/2013 14:55  Ref. Range 09/16/2013 05:57 09/16/2013 06:22 09/16/2013 11:29  Glucose-Capillary Latest Range: 70-99 mg/dL 59 (L) 95 81     Home DM Meds:   Lantus 65 units QHS Regular insulin- 5 units tid with meals   **Note patient had Hypoglycemia this AM that was easily corrected.  **Note Lantus dose reduced to 55 units QHS- Agree with this adjustment.    MD- May also want to consider the following:  1. Add Novolog Sensitive SSI tid ac + HS 2. Reduce Novolog Meal coverage to 4 units tid with meals     Will follow Ambrose FinlandJeannine Johnston Aolani Piggott RN, MSN, CDE Diabetes Coordinator Inpatient Diabetes Program Team Pager: [phone] (8a-10p)

## 2013-09-16 NOTE — Progress Notes (Signed)
Adult Psychoeducational Group Note  Date:  09/16/2013 Time:  10:00am Group Topic/Focus:  Making Healthy Choices:   The focus of this group is to help patients identify negative/unhealthy choices they were using prior to admission and identify positive/healthier coping strategies to replace them upon discharge.  Participation Level:  Did Not Attend  Participation Quality:    Affect:    Cognitive:    Insight:   Engagement in Group:   Modes of Intervention:    Additional Comments:  Pt did not attend group  Shelly BombardGarner, Murphy Duzan D 09/16/2013, 10:15 AM

## 2013-09-16 NOTE — Progress Notes (Signed)
NUTRITION ASSESSMENT  Pt identified as at risk on the Malnutrition Screen Tool  INTERVENTION: 1. Educated patient on the importance of nutrition and encouraged intake of food and beverages. 2. Supplements: Ensure Complete BID  NUTRITION DIAGNOSIS: Unintentional weight loss related to sub-optimal intake as evidenced by pt report.   Goal: Pt to meet >/= 90% of their estimated nutrition needs.  Monitor:  PO intake  Assessment:  Pt admitted to [facility] voluntarily for opiod detox and depression.   - Met with pt who reports 90 pound unintended weight loss in the past year - Said his appetite has been down for a long time however improving since admission - Said he's been asking for Ensure Complete, will order    63 y.o. male  Height: Ht Readings from Last 1 Encounters:  09/14/13 $RemoveB'6\' 2"'kfbBnHZV$  (1.88 m)    Weight: Wt Readings from Last 1 Encounters:  09/14/13 190 lb (86.183 kg)    Weight Hx: Wt Readings from Last 10 Encounters:  09/14/13 190 lb (86.183 kg)  09/13/13 190 lb (86.183 kg)    BMI:  Body mass index is 24.38 kg/(m^2). Pt meets criteria for normal weight based on current BMI.  Estimated Nutritional Needs: Kcal: 25-30 kcal/kg Protein: > 1 gram protein/kg Fluid: 1 ml/kcal  Diet Order: General Pt is also offered choice of unit snacks mid-morning and mid-afternoon.  Pt is eating as desired.   Lab results and medications reviewed.   Carlis Stable MS, [place], LDN [phone] Pager [phone] Weekend/After Hours Pager

## 2013-09-16 NOTE — Progress Notes (Signed)
Patient cbg rechecked and it is now 95.  Patient will be first in line for breakfast.  Patient morning insulin held.

## 2013-09-16 NOTE — Progress Notes (Signed)
[facility] MD Progress Note  09/16/2013 11:48 AM Henry Conner  MRN:  [id number] Subjective:   " I am starting to feel better" Objective:  Patient reports improvement compared to admission- acknowledges improving mood. He continues to endorse neuro-vegetative symptoms of depression such as low energy/ fair sleep. He is more conversant today, and more engageable. He is future oriented, and today states that he does not want to go to an inpatient rehab setting after discharge because " it reminds me too much of jail- I like freedom and outdoors". Plans to move to [place], [place], where he grew up , soon after discharge. States " my sister will drive me up, and I have somewhere to stay there". States that he will " not be thinking of drugs up there because there are none" Withdrawal symptoms are improved- decreased. Mild diarrhea / vague aches/pains persist, but does not appear to be in any acute distress. No disruptive behaviors on unit.  Diagnosis:  Depression NOS/Opiate Dependence/ Opiate WDL.  Total Time spent with patient: 25 minutes    ADL's:  Improving   Sleep: Fair, but improving   Appetite: Slowly improving   Suicidal Ideation:  Denies any SI  Homicidal Ideation:  Denies  AEB (as evidenced by):  Psychiatric Specialty Exam: Physical Exam  Review of Systems  Constitutional: Negative for fever and chills.  Respiratory: Negative for cough and shortness of breath.   Cardiovascular: Negative for chest pain.  Gastrointestinal: Positive for diarrhea. Negative for nausea and vomiting.  Musculoskeletal: Positive for myalgias.  Psychiatric/Behavioral: Positive for depression. Negative for suicidal ideas.    Blood pressure 149/70, pulse 59, temperature 97.9 F (36.6 C), temperature source Oral, resp. rate 18, height 6\' 2"  (1.88 m), weight 86.183 kg (190 lb), SpO2 100.00%.Body mass index is 24.38 kg/(m^2).  General Appearance: Fairly Groomed  Patent attorney::  improved   Speech:   Slow  Volume:  Decreased  Mood:  Depressed- but improved   Affect:  Congruent and reactive  Thought Process:  Linear  Orientation:  Full (Time, Place, and Person)  Thought Content:  no psychotic symptoms  Suicidal Thoughts:  No- at this time denies any suicidal or homicidal ideations  Homicidal Thoughts:  No  Memory:  NA  Judgement:  Fair  Insight:  Fair  Psychomotor Activity:  Normal  Concentration:  Good  Recall:  Good  Fund of Knowledge:Good  Language: Good  Akathisia:  Negative  Handed:  Right  AIMS (if indicated):     Assets:  Communication Skills Desire for Improvement  Sleep:  Number of Hours: 5.5   Musculoskeletal: Strength & Muscle Tone: within normal limits Gait & Station: normal Patient leans: N/A  Current Medications: Current Facility-Administered Medications  Medication Dose Route Frequency Provider Last Rate Last Dose  . acetaminophen (TYLENOL) tablet 650 mg  650 mg Oral Q6H PRN Beau Fanny, FNP      . alum & mag hydroxide-simeth (MAALOX/MYLANTA) 200-200-20 MG/5ML suspension 30 mL  30 mL Oral Q4H PRN Beau Fanny, FNP      . amitriptyline (ELAVIL) tablet 25 mg  25 mg Oral QHS Nehemiah Massed, MD   25 mg at 09/15/13 2103  . cloNIDine (CATAPRES) tablet 0.1 mg  0.1 mg Oral QID Beau Fanny, FNP   0.1 mg at 09/16/13 0830   Followed by  . [START ON 09/17/2013] cloNIDine (CATAPRES) tablet 0.1 mg  0.1 mg Oral BH-qamhs Beau Fanny, FNP       Followed by  . [  START ON 09/19/2013] cloNIDine (CATAPRES) tablet 0.1 mg  0.1 mg Oral QAC breakfast Beau Fanny, FNP      . dicyclomine (BENTYL) tablet 20 mg  20 mg Oral Q6H PRN Beau Fanny, FNP   20 mg at 09/15/13 1609  . hydrochlorothiazide (HYDRODIURIL) tablet 25 mg  25 mg Oral Daily Nehemiah Massed, MD   25 mg at 09/16/13 0831  . hydrOXYzine (ATARAX/VISTARIL) tablet 25 mg  25 mg Oral Q6H PRN Beau Fanny, FNP   25 mg at 09/15/13 2244  . insulin aspart (novoLOG) injection 5 Units  5 Units Subcutaneous TID WC Beau Fanny, FNP   5 Units at 09/15/13 1206  . insulin glargine (LANTUS) injection 65 Units  65 Units Subcutaneous QHS Beau Fanny, FNP   65 Units at 09/15/13 2103  . lisinopril (PRINIVIL,ZESTRIL) tablet 20 mg  20 mg Oral Daily Nehemiah Massed, MD   20 mg at 09/16/13 0831  . loperamide (IMODIUM) capsule 2-4 mg  2-4 mg Oral PRN Beau Fanny, FNP      . magnesium hydroxide (MILK OF MAGNESIA) suspension 30 mL  30 mL Oral Daily PRN Beau Fanny, FNP      . methocarbamol (ROBAXIN) tablet 500 mg  500 mg Oral Q8H PRN Beau Fanny, FNP   500 mg at 09/15/13 1726  . naproxen (NAPROSYN) tablet 500 mg  500 mg Oral BID PRN Beau Fanny, FNP   500 mg at 09/15/13 1609  . nicotine (NICODERM CQ - dosed in mg/24 hours) patch 21 mg  21 mg Transdermal Daily Beau Fanny, FNP   21 mg at 09/16/13 0830  . ondansetron (ZOFRAN-ODT) disintegrating tablet 4 mg  4 mg Oral Q6H PRN Beau Fanny, FNP      . QUEtiapine (SEROQUEL) tablet 200 mg  200 mg Oral QHS Nehemiah Massed, MD   200 mg at 09/15/13 2103  . sertraline (ZOLOFT) tablet 200 mg  200 mg Oral Daily Beau Fanny, FNP   200 mg at 09/16/13 1610    Lab Results:  Results for orders placed during the hospital encounter of 09/14/13 (from the past 48 hour(s))  GLUCOSE, CAPILLARY     Status: Abnormal   Collection Time    09/14/13  6:24 PM      Result Value Ref Range   Glucose-Capillary 174 (*) 70 - 99 mg/dL   Comment 1 Notify RN    GLUCOSE, CAPILLARY     Status: Abnormal   Collection Time    09/14/13  9:14 PM      Result Value Ref Range   Glucose-Capillary 113 (*) 70 - 99 mg/dL  GLUCOSE, CAPILLARY     Status: None   Collection Time    09/15/13  6:16 AM      Result Value Ref Range   Glucose-Capillary 91  70 - 99 mg/dL  GLUCOSE, CAPILLARY     Status: Abnormal   Collection Time    09/15/13 11:33 AM      Result Value Ref Range   Glucose-Capillary 122 (*) 70 - 99 mg/dL  GLUCOSE, CAPILLARY     Status: None   Collection Time    09/15/13  4:47 PM       Result Value Ref Range   Glucose-Capillary 94  70 - 99 mg/dL   Comment 1 Notify RN    GLUCOSE, CAPILLARY     Status: Abnormal   Collection Time    09/15/13  8:47 PM  Result Value Ref Range   Glucose-Capillary 104 (*) 70 - 99 mg/dL  GLUCOSE, CAPILLARY     Status: Abnormal   Collection Time    09/16/13  5:57 AM      Result Value Ref Range   Glucose-Capillary 59 (*) 70 - 99 mg/dL   Comment 1 Notify RN    GLUCOSE, CAPILLARY     Status: None   Collection Time    09/16/13  6:22 AM      Result Value Ref Range   Glucose-Capillary 95  70 - 99 mg/dL  GLUCOSE, CAPILLARY     Status: None   Collection Time    09/16/13 11:29 AM      Result Value Ref Range   Glucose-Capillary 81  70 - 99 mg/dL    Physical Findings: AIMS:  , ,  ,  ,    CIWA:    COWS:  COWS Total Score: 2  Assessment: Patient is improving. He seems less severely  depressed, and is more interactive/engaged. He is still having some opiate withdrawal symptoms, but does not appear to be in any acute distress, and vitals are stable. He is interested in relocating to [place] soon after his discharge.   Treatment Plan Summary: Daily contact with patient to assess and evaluate symptoms and progress in treatment Medication management See below  Plan: Continue inpatient treatment. Continue opiate Detoxification protocol, with clonidine and symptomatic treatment as needed. Continue Zoloft 200 mgrs QHS Continue Amitryptiline 25 mgrs QHS  Continue Seroquel 200 mgrs QHS As discussed with Nursing , will request Diabetes management consult, as patient's glycemias reported to be relatively low, particularly in AM.  Medical Decision Making Problem Points:  Established problem, stable/improving (1) and Review of psycho-social stressors (1) Data Points:  Review or order clinical lab tests (1) Review of medication regiment & side effects (2)  I certify that inpatient services furnished can reasonably be expected to improve the  patient's condition.   Hammad Finkler 09/16/2013, 11:48 AM

## 2013-09-16 NOTE — Progress Notes (Signed)
Patient cbg 59 this AM.  Patient asymptomatic.  Patient able to eat and drink.  Patient had fruit punch to drink and crackers at the bedside.  [name] notified.  Will recheck patient cbg
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
## 2013-09-17 LAB — GLUCOSE, CAPILLARY
GLUCOSE-CAPILLARY: 65 mg/dL — AB (ref 70–99)
GLUCOSE-CAPILLARY: 78 mg/dL (ref 70–99)
GLUCOSE-CAPILLARY: 117 mg/dL — AB (ref 70–99)
GLUCOSE-CAPILLARY: 119 mg/dL — AB (ref 70–99)
Glucose-Capillary: 114 mg/dL — ABNORMAL HIGH (ref 70–99)

## 2013-09-17 MED ORDER — QUETIAPINE FUMARATE 400 MG PO TABS
400.0000 mg | ORAL_TABLET | Freq: Every day | ORAL | Status: DC
  Administered 2013-09-17 – 2013-09-19 (×3): 400 mg via ORAL
  Filled 2013-09-17: qty 7
  Filled 2013-09-17 (×4): qty 1

## 2013-09-17 MED ORDER — INSULIN GLARGINE 100 UNIT/ML ~~LOC~~ SOLN
50.0000 [IU] | Freq: Every day | SUBCUTANEOUS | Status: DC
  Administered 2013-09-17: 50 [IU] via SUBCUTANEOUS

## 2013-09-17 MED ORDER — INSULIN ASPART 100 UNIT/ML ~~LOC~~ SOLN
4.0000 [IU] | Freq: Three times a day (TID) | SUBCUTANEOUS | Status: DC
  Administered 2013-09-17 – 2013-09-20 (×6): 4 [IU] via SUBCUTANEOUS

## 2013-09-17 NOTE — Progress Notes (Signed)
Inpatient Diabetes Program Recommendations  AACE/ADA: New Consensus Statement on Inpatient Glycemic Control (2013)  Target Ranges:  Prepandial:   less than 140 mg/dL      Peak postprandial:   less than 180 mg/dL (1-2 hours)      Critically ill patients:  140 - 180 mg/dL     Results for Henry Conner, [name] (MRN [id number]) as of 09/17/2013 11:42  Ref. Range 09/16/2013 05:57 09/16/2013 06:22 09/16/2013 11:29 09/16/2013 16:54 09/16/2013 20:50  Glucose-Capillary Latest Range: 70-99 mg/dL 59 (L) 95 81 130131 (H) 83    Results for Henry Conner, [name] (MRN [id number]) as of 09/17/2013 11:42  Ref. Range 09/17/2013 06:00 09/17/2013 06:15  Glucose-Capillary Latest Range: 70-99 mg/dL 65 (L) 78     **Hypoglycemic the last two mornings.  Note that Lantus was decreased to 55 units QHS last night.    MD- May also want to consider the following:   1. Add Novolog Sensitive SSI tid ac + HS  2. Reduce Novolog Meal coverage to 4 units tid with meals 3. Reduce Lantus to 50 units QHS     Will follow Henry [name] RN, MSN, CDE Diabetes Coordinator Inpatient Diabetes Program Team Pager: [phone] (8a-10p)

## 2013-09-17 NOTE — BHH Suicide Risk Assessment (Signed)
BHH INPATIENT:  Family/Significant Other Suicide Prevention Education  Suicide Prevention Education:  Patient Refusal for Family/Significant Other Suicide Prevention Education: The patient Henry Conner has refused to provide written consent for family/significant other to be provided Family/Significant Other Suicide Prevention Education during admission and/or prior to discharge.  Physician notified.  Tyan Lasure, West CarboKristin L 09/17/2013, 3:54 PM

## 2013-09-17 NOTE — [facility] Group Notes (Addendum)
[facility]  09/17/2013  10:44 AM  Participation Quality: Did Not Attend - patient observed sleeping in his bed, states that he does not feel well physically. CSW checked in with patient to assess his mood this morning. He reports that he "is not depressed" and is experiencing low anxiety levels. He is planning on going to stay with family in [place], [place] after discharge and reports that he has transportation. He confirms that he would like to be set up with outpatient appointments in [place] for therapy and medication management.   Samuella BruinKristin Maxim Bedel, MSW, [employer]WA Clinical Social Worker [facility] [phone]

## 2013-09-17 NOTE — Tx Team (Signed)
Interdisciplinary Treatment Plan Update (Adult) Date: 09/17/2013   Time Reviewed: 9:30 AM  Progress in Treatment: Attending groups: Minimally Participating in groups: Minimally Taking medication as prescribed: Yes Tolerating medication: Yes Family/Significant other contact made: CSW continuing to assess Patient understands diagnosis: Yes Discussing patient identified problems/goals with staff: Yes Medical problems stabilized or resolved: Yes Denies suicidal/homicidal ideation: Yes Issues/concerns per patient self-inventory: Yes Other:  New problem(s) identified: N/A  Discharge Plan or Barriers: CSW continuing to assess. Patient needs follow [place]. D/C anticipated for Monday 09/20/13.  Reason for Continuation of Hospitalization:  Depression Anxiety Medication Stabilization   Comments: N/A  Estimated length of stay: 2-4 days  For review of initial/current patient goals, please see plan of care.  Attendees: Patient:    Family:    Physician: Dr. Jama Flavorsobos; Dr. Dub MikesLugo 09/17/2013 9:30 AM  Nursing: Harold Barbanonecia Byrd , RN 09/17/2013 9:30 AM  Clinical Social Worker: Samuella BruinKristin Oreta Soloway,  LCSWA 09/17/2013 9:30 AM  Other: Juline PatchQuylle Hodnett, LCSW 09/17/2013 9:30 AM  Other: Leisa LenzValerie Enoch, Vesta MixerMonarch Liaison 09/17/2013 9:30 AM  Other:  09/17/2013 9:30 AM  Other:    Other:    Other:    Other:    Other:    Other:      Scribe for Treatment Team:  Samuella BruinKristin Adamaris King, MSW, [employer]CSWA [phone]

## 2013-09-17 NOTE — Progress Notes (Signed)
Henry Conner is seen sitting in the dayroom today  .   He  is flat, sad and depressed and has nothing to say, as it were. HE avoids eye     A Per MD order, HS seroquel is increased to 400 mg  , lantus insulin is decreased from 55 units to 50 units and his meal coverage insulin is decreased from 5 units q meal to 4 units novolog. Pt is apprised of these changes, states he understands and that they are " ok".   R Pt completed morning assessment tool and on it he wrote his depression,  Hopelessness and anxiety are "4/0/5" , respectively and he denied experiencing SI within the past 24 hrs. Safety is in place and poc moves forward.

## 2013-09-17 NOTE — Progress Notes (Signed)
Pt's CBG at 0600 was 65. Treated with 4oz fruit cup. On recheck at 0615 CBG is 78. Pt stable without complaint. Lawrence MarseillesFriedman, Maxton Noreen Eakes

## 2013-09-17 NOTE — Progress Notes (Signed)
Patient ID: Henry Conner, male   DOB: April 09, 1950, 63 y.o.   MRN: [id number] [facility] MD Progress Note  09/17/2013 1:17 PM Henry Conner  MRN:  [id number] Subjective:   Patient states he is feeling " not so good today", and reports worsening insomnia on lower Seroquel dose. Objective:  I have discussed case with treatment team and have met with patient. He remains depressed, but has improved partially compared to admission.  He is  Reporting decreased sleep on lower Seroquel dose. We have reviewed this medication, and patient aware of possible side effects, but states he has had none thus far. He states today that in the past he had episodes of auditory hallucinations, and this is one of the reasons he had been prescribed Seroquel. He is hoping to go back up on dosing- states " I slept better and I felt better on it". Of note, patient had submitted a 72 hour letter requesting discharge, but today states that " I still feel I need to be in the hospital" and plans to rescind letter. He continues to plan to relocate to [place], [place], soon after discharge from unit. He states this is a healthier environment for him and one where it will be easier for him to abstain from drug use. Appreciate Diabetes Consult regarding some hypoglycemia in AM.  At their advice- will decrease   Lantus to 50 mgrs QHS, and Novolog to 4 units TID with meatls.   Diagnosis:  Depression NOS/Opiate Dependence/ Opiate WDL.  Total Time spent with patient: 25 minutes    ADL's:  Improving   Sleep: States not sleeping well.  Appetite: Slowly improving   Suicidal Ideation:  Denies any SI  Homicidal Ideation:  Denies  AEB (as evidenced by):  Psychiatric Specialty Exam: Physical Exam  Review of Systems  Constitutional: Negative for fever and chills.  Respiratory: Negative for cough and shortness of breath.   Cardiovascular: Negative for chest pain.  Gastrointestinal: Positive for diarrhea. Negative for nausea and  vomiting.  Musculoskeletal: Positive for myalgias.  Psychiatric/Behavioral: Positive for depression. Negative for suicidal ideas.    Blood pressure 171/71, pulse 76, temperature 98.7 F (37.1 C), temperature source Oral, resp. rate 18, height 6\' 2"  (1.88 m), weight 86.183 kg (190 lb), SpO2 100.00%.Body mass index is 24.38 kg/(m^2).  General Appearance: improved grooming   Eye Contact::  improved   Speech:  Slow  Volume:  Decreased  Mood:  Depressed- but improved   Affect:  Congruent and reactive  Thought Process:  Linear  Orientation:  Full (Time, Place, and Person)  Thought Content:  no psychotic symptoms  Suicidal Thoughts:  No- at this time denies any suicidal or homicidal ideations  Homicidal Thoughts:  No  Memory:  NA  Judgement:  Fair  Insight:  Fair  Psychomotor Activity:  Normal  Concentration:  Good  Recall:  Good  Fund of Knowledge:Good  Language: Good  Akathisia:  Negative  Handed:  Right  AIMS (if indicated):     Assets:  Communication Skills Desire for Improvement  Sleep:  Number of Hours: 5.25   Musculoskeletal: Strength & Muscle Tone: within normal limits Gait & Station: normal Patient leans: N/A  Current Medications: Current Facility-Administered Medications  Medication Dose Route Frequency Provider Last Rate Last Dose  . acetaminophen (TYLENOL) tablet 650 mg  650 mg Oral Q6H PRN [id number], FNP   650 mg at 09/17/13 1113  . alum & mag hydroxide-simeth (MAALOX/MYLANTA) 200-200-20 MG/5ML suspension 30 mL  30 mL  Oral Q4H PRN Benjamine Mola, FNP      . amitriptyline (ELAVIL) tablet 25 mg  25 mg Oral QHS Neita Garnet, MD   25 mg at 09/16/13 2123  . cloNIDine (CATAPRES) tablet 0.1 mg  0.1 mg Oral BH-qamhs Benjamine Mola, FNP   0.1 mg at 09/17/13 0854   Followed by  . [START ON 09/19/2013] cloNIDine (CATAPRES) tablet 0.1 mg  0.1 mg Oral QAC breakfast Benjamine Mola, FNP      . dicyclomine (BENTYL) tablet 20 mg  20 mg Oral Q6H PRN Benjamine Mola, FNP   20 mg  at 09/15/13 1609  . feeding supplement (ENSURE COMPLETE) (ENSURE COMPLETE) liquid 237 mL  237 mL Oral BID BM Toribio Harbour, RD   237 mL at 09/17/13 1000  . hydrochlorothiazide (HYDRODIURIL) tablet 25 mg  25 mg Oral Daily Neita Garnet, MD   25 mg at 09/17/13 0854  . hydrOXYzine (ATARAX/VISTARIL) tablet 25 mg  25 mg Oral Q6H PRN Benjamine Mola, FNP   25 mg at 09/15/13 2244  . insulin aspart (novoLOG) injection 5 Units  5 Units Subcutaneous TID WC Benjamine Mola, FNP   5 Units at 09/17/13 1200  . insulin glargine (LANTUS) injection 50 Units  50 Units Subcutaneous QHS Neita Garnet, MD      . lisinopril (PRINIVIL,ZESTRIL) tablet 20 mg  20 mg Oral Daily Neita Garnet, MD   20 mg at 09/17/13 0854  . loperamide (IMODIUM) capsule 2-4 mg  2-4 mg Oral PRN Benjamine Mola, FNP      . magnesium hydroxide (MILK OF MAGNESIA) suspension 30 mL  30 mL Oral Daily PRN Benjamine Mola, FNP      . methocarbamol (ROBAXIN) tablet 500 mg  500 mg Oral Q8H PRN Benjamine Mola, FNP   500 mg at 09/17/13 1113  . naproxen (NAPROSYN) tablet 500 mg  500 mg Oral BID PRN Benjamine Mola, FNP   500 mg at 09/15/13 1609  . nicotine (NICODERM CQ - dosed in mg/24 hours) patch 21 mg  21 mg Transdermal Daily Benjamine Mola, FNP   21 mg at 09/17/13 0854  . ondansetron (ZOFRAN-ODT) disintegrating tablet 4 mg  4 mg Oral Q6H PRN Benjamine Mola, FNP      . QUEtiapine (SEROQUEL) tablet 200 mg  200 mg Oral QHS Neita Garnet, MD   200 mg at 09/16/13 2123  . sertraline (ZOLOFT) tablet 200 mg  200 mg Oral Daily Benjamine Mola, FNP   200 mg at 09/17/13 5732    Lab Results:  Results for orders placed during the hospital encounter of 09/14/13 (from the past 48 hour(s))  GLUCOSE, CAPILLARY     Status: None   Collection Time    09/15/13  4:47 PM      Result Value Ref Range   Glucose-Capillary 94  70 - 99 mg/dL   Comment 1 Notify RN    GLUCOSE, CAPILLARY     Status: Abnormal   Collection Time    09/15/13  8:47 PM      Result Value Ref  Range   Glucose-Capillary 104 (*) 70 - 99 mg/dL  GLUCOSE, CAPILLARY     Status: Abnormal   Collection Time    09/16/13  5:57 AM      Result Value Ref Range   Glucose-Capillary 59 (*) 70 - 99 mg/dL   Comment 1 Notify RN    GLUCOSE, CAPILLARY     Status: None  Collection Time    09/16/13  6:22 AM      Result Value Ref Range   Glucose-Capillary 95  70 - 99 mg/dL  GLUCOSE, CAPILLARY     Status: None   Collection Time    09/16/13 11:29 AM      Result Value Ref Range   Glucose-Capillary 81  70 - 99 mg/dL  GLUCOSE, CAPILLARY     Status: Abnormal   Collection Time    09/16/13  4:54 PM      Result Value Ref Range   Glucose-Capillary 131 (*) 70 - 99 mg/dL  GLUCOSE, CAPILLARY     Status: None   Collection Time    09/16/13  8:50 PM      Result Value Ref Range   Glucose-Capillary 83  70 - 99 mg/dL  GLUCOSE, CAPILLARY     Status: Abnormal   Collection Time    09/17/13  6:00 AM      Result Value Ref Range   Glucose-Capillary 65 (*) 70 - 99 mg/dL  GLUCOSE, CAPILLARY     Status: None   Collection Time    09/17/13  6:15 AM      Result Value Ref Range   Glucose-Capillary 78  70 - 99 mg/dL  GLUCOSE, CAPILLARY     Status: Abnormal   Collection Time    09/17/13 12:19 PM      Result Value Ref Range   Glucose-Capillary 117 (*) 70 - 99 mg/dL    Physical Findings: AIMS:  , ,  ,  ,    CIWA:    COWS:  COWS Total Score: 2  Assessment: Partial improvement. Remains depressed, but affect more reactive. At this time focused on insomnia and states he is still not feeling ready for discharge and plans to rescind a 72 hour letter he had submitted.  No acute withdrawal symptoms noted or reported. Tolerating Zoloft, Amitryptiline and Seroquel well. States that higher dose of Seroquel was well tolerated and helpful for insomnia, and today states he has a history of hallucinations in the past which Seroquel treated. Today is not endorsing or presenting with any psychotic symptoms.   Treatment Plan  Summary: Daily contact with patient to assess and evaluate symptoms and progress in treatment Medication management See below  Plan: Continue inpatient treatment. Treatment team working on disposition planning, patient plans to follow up as outpatient locally in [place] once he relocates there. Continue Zoloft 200 mgrs QHS Continue Amitryptiline 25 mgrs QHS  Increase Seroquel to 400  mgrs QHS As recommended by Diabetes Consult, decrease Novolog to 4 units TID and Lantus to 50 units QHS. Medical Decision Making Problem Points:  Established problem, stable/improving (1) and Review of psycho-social stressors (1) Data Points:  Review or order clinical lab tests (1) Review of medication regiment & side effects (2) Review of new medications or change in dosage (2)  I certify that inpatient services furnished can reasonably be expected to improve the patient's condition.   Kaye Luoma 09/17/2013, 1:17 PM

## 2013-09-17 NOTE — Progress Notes (Signed)
Pt awoke asking for snack. Reports hunger but also anxious his CBG will be low again when he is checked before breakfast. Per evening shift nurse, pt's intake has been low today. Allowed to have fruit cup and small ice cream. Pt denies any other complaints. Lawrence MarseillesFriedman, Mosiah Bastin Eakes

## 2013-09-17 NOTE — [facility] Group Notes (Signed)
[facility]  09/17/2013 3:37 PM  Type of Therapy:  Group Therapy  Participation Level:  Did Not Attend  Wynn BankerHodnett, Alease Fait Hairston 09/17/2013, 3:37 PM

## 2013-09-18 LAB — GLUCOSE, CAPILLARY
GLUCOSE-CAPILLARY: 93 mg/dL (ref 70–99)
GLUCOSE-CAPILLARY: 98 mg/dL (ref 70–99)
GLUCOSE-CAPILLARY: 95 mg/dL (ref 70–99)
Glucose-Capillary: 107 mg/dL — ABNORMAL HIGH (ref 70–99)

## 2013-09-18 MED ORDER — LIDOCAINE 5 % EX PTCH
1.0000 | MEDICATED_PATCH | CUTANEOUS | Status: AC
  Administered 2013-09-18 – 2013-09-19 (×2): 1 via TRANSDERMAL
  Filled 2013-09-18 (×3): qty 1

## 2013-09-18 NOTE — Progress Notes (Signed)
Report received for Henry BrunnerJ. Webb RN. Writer entered patients room and observed him standing by his bed and his bed cover was wet. Linen was changed and patient returned to bed with no other request or complaints. Safety maintained on unit with 15 min checks.

## 2013-09-18 NOTE — Progress Notes (Addendum)
Pt was not given insulin at 5pm due to his blood sugar 98. Pt requested medication for low back pain. He was given 500mg  of robaxina dn 500mg  of naprosyn. Pt also was given warm compresses to apply to his back. His affect remains flat and pt does appear depressed. He does contract for safety and denies Si and HI.Lidocine patch applied to pts lower back as well as a warm compress. Pt positioned on his side with several pillows. Bedtime insulin held due to low blood sugar.

## 2013-09-18 NOTE — [facility] Group Notes (Signed)
[facility] Group Notes: (Clinical Social Work)   09/18/2013      Type of Therapy:  Group Therapy   Participation Level:  Did Not Attend    Nakai Yard Grossman-Orr, LCSW 09/18/2013, 2:43 PM     

## 2013-09-18 NOTE — Progress Notes (Signed)
Patient ID: Henry BalsamCalvin Conner, male   DOB: 02/06/1950, 63 y.o.   MRN: [id number]   [facility]  09/18/2013 12:32 PM Henry BalsamCalvin Conner  MRN:  [id number] Subjective:   Patient states "I am not as depressed as I was. I want to be somewhere I can't get heroine. It[place] much harder to get in [place] it [place]. I'm still upset about my back pain. I got cut off from a pain clinic because I was smoking marijuana."   Objective:  Patient is seen and chart is reviewed. The patient is reporting decreased symptoms of depression. Rates as a four today compared to a ten on admission. However, his mood continues to appear depressed. Patient is compliant with medications and denies any adverse effects. He reports that his back pain makes his depression worse. Patient is hopeful that he will get into a pain clinic after discharge. He reports sleeping better with the recent increase in his Seroquel dosage.   Diagnosis:  Depression NOS/Opiate Dependence/ Opiate WDL.  Total Time spent with patient: 25 minutes  ADL's:  Improving   Sleep: Improving   Appetite: Fair   Suicidal Ideation:  Denies any SI  Homicidal Ideation:  Denies  AEB (as evidenced by):  Psychiatric Specialty Exam: Physical Exam  Review of Systems  Constitutional: Negative.   HENT: Negative.   Eyes: Negative.   Respiratory: Negative.  Negative for cough and shortness of breath.   Cardiovascular: Negative.  Negative for chest pain.  Gastrointestinal: Negative.  Negative for nausea and vomiting.  Genitourinary: Negative.   Musculoskeletal: Positive for back pain and myalgias.  Skin: Negative.   Neurological: Negative.   Endo/Heme/Allergies: Negative.   Psychiatric/Behavioral: Positive for depression and substance abuse (UDS positive for opiates and marijuana ). Negative for suicidal ideas and hallucinations. The patient is nervous/anxious and has insomnia.     Blood pressure 104/69, pulse 73, temperature 98.2 F (36.8  C), temperature source Oral, resp. rate 16, height 6\' 2"  (1.88 m), weight 86.183 kg (190 lb), SpO2 100.00%.Body mass index is 24.38 kg/(m^2).  General Appearance: improved grooming   Eye Contact::  Fair  Speech:  Slow  Volume:  Decreased  Mood:  Depressed  Affect:  Flat  Thought Process:  Linear  Orientation:  Full (Time, Place, and Person)  Thought Content:  Rumination  Suicidal Thoughts:  No- at this time denies any suicidal or homicidal ideations  Homicidal Thoughts:  No  Memory:  NA  Judgement:  Fair  Insight:  Fair  Psychomotor Activity:  Normal  Concentration:  Good  Recall:  Good  Fund of Knowledge:Good  Language: Good  Akathisia:  Negative  Handed:  Right  AIMS (if indicated):     Assets:  Communication Skills Desire for Improvement Resilience  Sleep:  Number of Hours: 6.75   Musculoskeletal: Strength & Muscle Tone: within normal limits Gait & Station: normal Patient leans: N/A  Current Medications: Current Facility-Administered Medications  Medication Dose Route Frequency Provider Last Rate Last Dose  . acetaminophen (TYLENOL) tablet 650 mg  650 mg Oral Q6H PRN Beau FannyJohn C Withrow, FNP   650 mg at 09/17/13 1113  . alum & mag hydroxide-simeth (MAALOX/MYLANTA) 200-200-20 MG/5ML suspension 30 mL  30 mL Oral Q4H PRN Beau FannyJohn C Withrow, FNP      . amitriptyline (ELAVIL) tablet 25 mg  25 mg Oral QHS Nehemiah MassedFernando Cobos, MD   25 mg at 09/17/13 2129  . cloNIDine (CATAPRES) tablet 0.1 mg  0.1 mg Oral BH-qamhs John  C Withrow, FNP   0.1 mg at 09/18/13 1037   Followed by  . [START ON 09/19/2013] cloNIDine (CATAPRES) tablet 0.1 mg  0.1 mg Oral QAC breakfast Beau Fanny, FNP      . dicyclomine (BENTYL) tablet 20 mg  20 mg Oral Q6H PRN Beau Fanny, FNP   20 mg at 09/15/13 1609  . feeding supplement (ENSURE COMPLETE) (ENSURE COMPLETE) liquid 237 mL  237 mL Oral BID BM Tenny Craw, RD   237 mL at 09/17/13 1728  . hydrochlorothiazide (HYDRODIURIL) tablet 25 mg  25 mg Oral Daily  Nehemiah Massed, MD   25 mg at 09/18/13 1036  . hydrOXYzine (ATARAX/VISTARIL) tablet 25 mg  25 mg Oral Q6H PRN Beau Fanny, FNP   25 mg at 09/15/13 2244  . insulin aspart (novoLOG) injection 4 Units  4 Units Subcutaneous TID WC Nehemiah Massed, MD   4 Units at 09/18/13 [phone]  . insulin glargine (LANTUS) injection 50 Units  50 Units Subcutaneous QHS Nehemiah Massed, MD   50 Units at 09/17/13 2133  . lisinopril (PRINIVIL,ZESTRIL) tablet 20 mg  20 mg Oral Daily Nehemiah Massed, MD   20 mg at 09/18/13 1037  . loperamide (IMODIUM) capsule 2-4 mg  2-4 mg Oral PRN Beau Fanny, FNP      . magnesium hydroxide (MILK OF MAGNESIA) suspension 30 mL  30 mL Oral Daily PRN Beau Fanny, FNP      . methocarbamol (ROBAXIN) tablet 500 mg  500 mg Oral Q8H PRN Beau Fanny, FNP   500 mg at 09/17/13 2130  . naproxen (NAPROSYN) tablet 500 mg  500 mg Oral BID PRN Beau Fanny, FNP   500 mg at 09/17/13 1831  . nicotine (NICODERM CQ - dosed in mg/24 hours) patch 21 mg  21 mg Transdermal Daily Beau Fanny, FNP   21 mg at 09/17/13 0854  . ondansetron (ZOFRAN-ODT) disintegrating tablet 4 mg  4 mg Oral Q6H PRN Beau Fanny, FNP      . QUEtiapine (SEROQUEL) tablet 400 mg  400 mg Oral QHS Nehemiah Massed, MD   400 mg at 09/17/13 2130  . sertraline (ZOLOFT) tablet 200 mg  200 mg Oral Daily Beau Fanny, FNP   200 mg at 09/18/13 1036    Lab Results:  Results for orders placed during the hospital encounter of 09/14/13 (from the past 48 hour(s))  GLUCOSE, CAPILLARY     Status: Abnormal   Collection Time    09/16/13  4:54 PM      Result Value Ref Range   Glucose-Capillary 131 (*) 70 - 99 mg/dL  GLUCOSE, CAPILLARY     Status: None   Collection Time    09/16/13  8:50 PM      Result Value Ref Range   Glucose-Capillary 83  70 - 99 mg/dL  GLUCOSE, CAPILLARY     Status: Abnormal   Collection Time    09/17/13  6:00 AM      Result Value Ref Range   Glucose-Capillary 65 (*) 70 - 99 mg/dL  GLUCOSE, CAPILLARY      Status: None   Collection Time    09/17/13  6:15 AM      Result Value Ref Range   Glucose-Capillary 78  70 - 99 mg/dL  GLUCOSE, CAPILLARY     Status: Abnormal   Collection Time    09/17/13 12:19 PM      Result Value Ref Range   Glucose-Capillary 117 (*)  70 - 99 mg/dL  GLUCOSE, CAPILLARY     Status: Abnormal   Collection Time    09/17/13  5:12 PM      Result Value Ref Range   Glucose-Capillary 114 (*) 70 - 99 mg/dL  GLUCOSE, CAPILLARY     Status: Abnormal   Collection Time    09/17/13  8:46 PM      Result Value Ref Range   Glucose-Capillary 119 (*) 70 - 99 mg/dL  GLUCOSE, CAPILLARY     Status: Abnormal   Collection Time    09/18/13  6:14 AM      Result Value Ref Range   Glucose-Capillary 107 (*) 70 - 99 mg/dL    Physical Findings: AIMS: Facial and Oral Movements Muscles of Facial Expression: None, normal Lips and Perioral Area: None, normal Jaw: None, normal Tongue: None, normal,Extremity Movements Upper (arms, wrists, hands, fingers): None, normal Lower (legs, knees, ankles, toes): None, normal, Trunk Movements Neck, shoulders, hips: None, normal, Overall Severity Severity of abnormal movements (highest score from questions above): None, normal Incapacitation due to abnormal movements: None, normal Patient's awareness of abnormal movements (rate only patient's report): No Awareness, Dental Status Current problems with teeth and/or dentures?: No Does patient usually wear dentures?: No  CIWA:    COWS:  COWS Total Score: 5  Assessment:  Treatment Plan Summary: Daily contact with patient to assess and evaluate symptoms and progress in treatment Medication management See below  Plan: 1. Continue inpatient treatment. 2. Treatment team working on disposition planning, patient plans to follow up as outpatient locally in [place] once he relocates there. 3. Continue Zoloft 200 mgrs QHS 4. Continue Amitryptiline 25 mgrs QHS  5. Continue Seroquel to 400  mgrs QHS 6. As  recommended by Diabetes Consult, decrease Novolog to 4 units TID and Lantus to 50 units QHS.  Medical Decision Making Problem Points:  Established problem, stable/improving (1) and Review of psycho-social stressors (1) Data Points:  Review or order clinical lab tests (1) Review of medication regiment & side effects (2) Review of new medications or change in dosage (2)  I certify that inpatient services furnished can reasonably be expected to improve the patient's condition.   Mat Stuard NP-C 09/18/2013, 12:32 PM

## 2013-09-18 NOTE — Progress Notes (Signed)
Psychoeducational Group Note  Date:  09/18/2013 Time:  2341  Group Topic/Focus:  Wrap-Up Group:   The focus of this group is to help patients review their daily goal of treatment and discuss progress on daily workbooks.  Participation Level: Did Not Attend  Participation Quality:  Not Applicable  Affect:  Not Applicable  Cognitive:  Not Applicable  Insight:  Not Applicable  Engagement in Group: Not Applicable  Additional Comments:  The patient did not attend group this evening.   Hazle CocaGOODMAN, Zakarie Sturdivant S 09/18/2013, 11:41 PM

## 2013-09-18 NOTE — Progress Notes (Signed)
Case discussed with NP.  Chart reviewed. Agree with above assessment and plan.  Monea Pesantez, MD 

## 2013-09-18 NOTE — Progress Notes (Signed)
Pt resting in bed with eyes closed. No distress noted. Will continue to monitor closely.  

## 2013-09-18 NOTE — Progress Notes (Signed)
Henry Conner remains acutely depressed and tired, feeling "awful", as he puts it...staying in his bed and isolating.   A He is requested, by this writer, to complete his morning assessment and  He has yet to do so, but is encouraged by this nurse.   R Safety is in place and poc moves forward.

## 2013-09-18 NOTE — Progress Notes (Signed)
Pt says he feels "awful" stays in his bed, pre cbg 93. Pt ate under 1/3 lunch and novolog insulin held due to pt's poor po intake, feelings of sickness and low cbg's.

## 2013-09-19 LAB — GLUCOSE, CAPILLARY
GLUCOSE-CAPILLARY: 82 mg/dL (ref 70–99)
GLUCOSE-CAPILLARY: 115 mg/dL — AB (ref 70–99)
Glucose-Capillary: 97 mg/dL (ref 70–99)
Glucose-Capillary: 147 mg/dL — ABNORMAL HIGH (ref 70–99)

## 2013-09-19 MED ORDER — INSULIN GLARGINE 100 UNIT/ML ~~LOC~~ SOLN
45.0000 [IU] | Freq: Every day | SUBCUTANEOUS | Status: DC
  Administered 2013-09-19: 45 [IU] via SUBCUTANEOUS

## 2013-09-19 MED ORDER — KETOROLAC TROMETHAMINE 60 MG/2ML IM SOLN
60.0000 mg | Freq: Once | INTRAMUSCULAR | Status: AC
  Administered 2013-09-19: 60 mg via INTRAMUSCULAR
  Filled 2013-09-19: qty 2

## 2013-09-19 NOTE — [facility] Group Notes (Signed)
[facility] Group Notes: (Clinical Social Work)   09/19/2013      Type of Therapy:  Group Therapy   Participation Level:  Did Not Attend    Ambrose Mantle, LCSW 09/19/2013, 2:27 PM

## 2013-09-19 NOTE — Progress Notes (Signed)
Psychoeducational Group Note  Date: 09/19/2013 Time: 1045  Group Topic/Focus:  Making Healthy Choices:   The focus of this group is to help patients identify negative/unhealthy choices they were using prior to admission and identify positive/healthier coping strategies to replace them upon discharge.  Participation Level:  Active  Participation Quality:  Appropriate  Affect:  Depressed  Cognitive:  Alert  Insight:  Engaged  Engagement in Group:  Engaged  Additional Comments:    09/19/2013,3:21 PM Mamye Bolds, Joie Bimler

## 2013-09-19 NOTE — Progress Notes (Signed)
Henry Conner has remained in his bed al day today. HE did not attend any groups and / or meals off of the unit  and he has not interacted with any staff and or other patients today.   A HE did complete his daily assessment sheet and on it he wrote  He denied SI within the past 24 hrs, he rated his depression, hopelessness and anxiety "4/4/4", respectively,  and he said " i don't know" when he was questioned about his DC plan.   R Safety is in place and poc includes giving him IM of toradol 60 mg x1 today , lidocaine pain patch administered after dinner this eveninng.

## 2013-09-19 NOTE — [facility] Group Notes (Signed)
0900 nursing orientation group   The focus of this group is to educate the patient on the purpose and policies of crisis stabilization and provide a format to answer questions about their admission.  The group details unit policies and expectations of patients while admitted.   Pt did not attend she was in her room lying in bed and refused to get up for group.

## 2013-09-19 NOTE — Progress Notes (Signed)
Psychoeducational Group Note  Date:  09/19/2013 Time:  2355  Group Topic/Focus:  Wrap-Up Group:   The focus of this group is to help patients review their daily goal of treatment and discuss progress on daily workbooks.  Participation Level: Did Not Attend  Participation Quality:  Not Applicable  Affect:  Not Applicable  Cognitive:  Not Applicable  Insight:  Not Applicable  Engagement in Group: Not Applicable  Additional Comments:  The patient did not attend group this evening.   Hazle Coca S 09/19/2013, 11:55 PM

## 2013-09-19 NOTE — Progress Notes (Signed)
Case discussed with NP. Chart reviewed. Concur with above assessment and plan.  Tyshaun Vinzant, MD 

## 2013-09-19 NOTE — Progress Notes (Signed)
Patient ID: Henry Conner, male   DOB: [birth year], 63 y.o.   MRN: [id number]   [facility] MD Progress Note  09/19/2013 11:52 AM Henry Conner  MRN:  [id number] Subjective:   Patient states "I am not doing well I am in a lot of pain. Pt states he would like to move back to [place], [place]. want to be somewhere I can't get heroin. It's much harder to get in [place] than it is in [place]. I havent been going to group because of my pain and I hurt so bad." He denies SI/HI/AVH at this time.    Objective:  Patient is observed lying in bed and chart is reviewed. The patient is reporting cessation of symptoms of depression"I am not depressed I am just in pain". However, his mood continues to appear depressed. Patient is compliant with medications and denies any adverse effects. He reports that his back pain makes his depression worse. Denies any cravings or withdrwal symptoms except muscle aches.   Diagnosis:  Depression NOS/Opiate Dependence/ Opiate WDL.  Total Time spent with patient: 25 minutes  ADL's:  Improving   Sleep: Improving   Appetite: Fair   Suicidal Ideation:  Denies any SI  Homicidal Ideation:  Denies  AEB (as evidenced by):  Psychiatric Specialty Exam: Physical Exam   Review of Systems  Constitutional: Negative.   HENT: Negative.   Eyes: Negative.   Respiratory: Negative.  Negative for cough and shortness of breath.   Cardiovascular: Negative.  Negative for chest pain.  Gastrointestinal: Negative.  Negative for nausea and vomiting.  Genitourinary: Negative.   Musculoskeletal: Positive for back pain and myalgias.  Skin: Negative.   Neurological: Negative.   Endo/Heme/Allergies: Negative.   Psychiatric/Behavioral: Positive for depression and substance abuse (UDS positive for opiates and marijuana ). Negative for suicidal ideas and hallucinations. The patient is nervous/anxious and has insomnia.     Blood pressure 140/83, pulse 80, temperature 98.7 F (37.1 C),  temperature source Oral, resp. rate 18, height  (1.88 m), weight 86.183 kg (190 lb), SpO2 100.00%.Body mass index is 24.38 kg/(m^2).  General Appearance: improved grooming   Eye Contact::  Fair  Speech:  Slow  Volume:  Decreased  Mood:  Depressed  Affect:  Flat  Thought Process:  Linear  Orientation:  Full (Time, Place, and Person)  Thought Content:  Rumination  Suicidal Thoughts:  No- at this time denies any suicidal or homicidal ideations  Homicidal Thoughts:  No  Memory:  NA  Judgement:  Fair  Insight:  Fair  Psychomotor Activity:  Normal  Concentration:  Good  Recall:  Good  Fund of Knowledge:Good  Language: Good  Akathisia:  Negative  Handed:  Right  AIMS (if indicated):     Assets:  Communication Skills Desire for Improvement Resilience  Sleep:  Number of Hours: 5.75   Musculoskeletal: Strength & Muscle Tone: within normal limits Gait & Station: normal Patient leans: N/A  Current Medications: Current Facility-Administered Medications  Medication Dose Route Frequency Provider Last Rate Last Dose  . acetaminophen (TYLENOL) tablet 650 mg  650 mg Oral Q6H PRN Beau Fanny, FNP   650 mg at 09/17/13 1113  . alum & mag hydroxide-simeth (MAALOX/MYLANTA) 200-200-20 MG/5ML suspension 30 mL  30 mL Oral Q4H PRN Beau Fanny, FNP      . amitriptyline (ELAVIL) tablet 25 mg  25 mg Oral QHS Nehemiah Massed, MD   25 mg at 09/18/13 2056  . cloNIDine (CATAPRES) tablet 0.1 mg  0.1 mg  Oral QAC breakfast Beau Fanny, FNP      . dicyclomine (BENTYL) tablet 20 mg  20 mg Oral Q6H PRN Beau Fanny, FNP   20 mg at 09/15/13 1609  . feeding supplement (ENSURE COMPLETE) (ENSURE COMPLETE) liquid 237 mL  237 mL Oral BID BM Tenny Craw, RD   237 mL at 09/18/13 1400  . hydrochlorothiazide (HYDRODIURIL) tablet 25 mg  25 mg Oral Daily Nehemiah Massed, MD   25 mg at 09/18/13 1036  . hydrOXYzine (ATARAX/VISTARIL) tablet 25 mg  25 mg Oral Q6H PRN Beau Fanny, FNP   25 mg at 09/15/13  2244  . insulin aspart (novoLOG) injection 4 Units  4 Units Subcutaneous TID WC Nehemiah Massed, MD   4 Units at 09/19/13 [phone]  . insulin glargine (LANTUS) injection 45 Units  45 Units Subcutaneous QHS Truman Hayward, FNP      . ketorolac (TORADOL) injection 60 mg  60 mg Intramuscular Once Truman Hayward, FNP      . lidocaine (LIDODERM) 5 % 1 patch  1 patch Transdermal Q24H Kristeen Mans, NP   1 patch at 09/18/13 2146  . lisinopril (PRINIVIL,ZESTRIL) tablet 20 mg  20 mg Oral Daily Nehemiah Massed, MD   20 mg at 09/18/13 1037  . loperamide (IMODIUM) capsule 2-4 mg  2-4 mg Oral PRN Beau Fanny, FNP      . magnesium hydroxide (MILK OF MAGNESIA) suspension 30 mL  30 mL Oral Daily PRN Beau Fanny, FNP      . methocarbamol (ROBAXIN) tablet 500 mg  500 mg Oral Q8H PRN Beau Fanny, FNP   500 mg at 09/18/13 1726  . naproxen (NAPROSYN) tablet 500 mg  500 mg Oral BID PRN Beau Fanny, FNP   500 mg at 09/18/13 1727  . nicotine (NICODERM CQ - dosed in mg/24 hours) patch 21 mg  21 mg Transdermal Daily Beau Fanny, FNP   21 mg at 09/18/13 0800  . ondansetron (ZOFRAN-ODT) disintegrating tablet 4 mg  4 mg Oral Q6H PRN Beau Fanny, FNP      . QUEtiapine (SEROQUEL) tablet 400 mg  400 mg Oral QHS Nehemiah Massed, MD   400 mg at 09/18/13 2057  . sertraline (ZOLOFT) tablet 200 mg  200 mg Oral Daily Beau Fanny, FNP   200 mg at 09/18/13 1036    Lab Results:  Results for orders placed during the hospital encounter of 09/14/13 (from the past 48 hour(s))  GLUCOSE, CAPILLARY     Status: Abnormal   Collection Time    09/17/13 12:19 PM      Result Value Ref Range   Glucose-Capillary 117 (*) 70 - 99 mg/dL  GLUCOSE, CAPILLARY     Status: Abnormal   Collection Time    09/17/13  5:12 PM      Result Value Ref Range   Glucose-Capillary 114 (*) 70 - 99 mg/dL  GLUCOSE, CAPILLARY     Status: Abnormal   Collection Time    09/17/13  8:46 PM      Result Value Ref Range   Glucose-Capillary 119 (*) 70 - 99  mg/dL  GLUCOSE, CAPILLARY     Status: Abnormal   Collection Time    09/18/13  6:14 AM      Result Value Ref Range   Glucose-Capillary 107 (*) 70 - 99 mg/dL  GLUCOSE, CAPILLARY     Status: None   Collection Time    09/18/13 12:10  PM      Result Value Ref Range   Glucose-Capillary 93  70 - 99 mg/dL   Comment 1 Notify RN    GLUCOSE, CAPILLARY     Status: None   Collection Time    09/18/13  5:15 PM      Result Value Ref Range   Glucose-Capillary 98  70 - 99 mg/dL  GLUCOSE, CAPILLARY     Status: None   Collection Time    09/18/13  9:23 PM      Result Value Ref Range   Glucose-Capillary 95  70 - 99 mg/dL    Physical Findings: AIMS: Facial and Oral Movements Muscles of Facial Expression: None, normal Lips and Perioral Area: None, normal Jaw: None, normal Tongue: None, normal,Extremity Movements Upper (arms, wrists, hands, fingers): None, normal Lower (legs, knees, ankles, toes): None, normal, Trunk Movements Neck, shoulders, hips: None, normal, Overall Severity Severity of abnormal movements (highest score from questions above): None, normal Incapacitation due to abnormal movements: None, normal Patient's awareness of abnormal movements (rate only patient's report): No Awareness, Dental Status Current problems with teeth and/or dentures?: No Does patient usually wear dentures?: No  CIWA:    COWS:  COWS Total Score: 0  Assessment:  Treatment Plan Summary: Daily contact with patient to assess and evaluate symptoms and progress in treatment Medication management See below  Plan: 1. Continue inpatient treatment. 2. Treatment team working on disposition planning, patient plans to follow up as outpatient locally in [place] once he relocates there. 3. Continue Zoloft 200 mgrs QHS 4. Continue Amitryptiline 25 mgrs QHS  5. Continue Seroquel to 400  mgrs QHS 6. As recommended by Diabetes Consult, decrease Novolog to 4 units TID and Lantus to 45 units QHS. Previously adjustments  have helped with hypoglycemia. Will reduce to 45 units due to continuous low readings however as his appetite begins to increase may need to adjust insulin levels again.  7. TOradol  IM in a single dose for muscle pain.   Medical Decision Making Problem Points:  Established problem, stable/improving (1) and Review of psycho-social stressors (1) Data Points:  Review or order clinical lab tests (1) Review of medication regiment & side effects (2) Review of new medications or change in dosage (2)  I certify that inpatient services furnished can reasonably be expected to improve the patient's condition.   Malachy Chamber S  FNP-BC  09/19/2013, 11:52 AM

## 2013-09-20 DIAGNOSIS — F1120 Opioid dependence, uncomplicated: Secondary | ICD-10-CM | POA: Diagnosis present

## 2013-09-20 LAB — GLUCOSE, CAPILLARY
GLUCOSE-CAPILLARY: 141 mg/dL — AB (ref 70–99)
GLUCOSE-CAPILLARY: 117 mg/dL — AB (ref 70–99)
Glucose-Capillary: 129 mg/dL — ABNORMAL HIGH (ref 70–99)

## 2013-09-20 MED ORDER — SERTRALINE HCL 100 MG PO TABS: 200.0000 mg | ORAL_TABLET | Freq: Every day | ORAL | Status: AC

## 2013-09-20 MED ORDER — INSULIN REGULAR HUMAN 100 UNIT/ML IJ SOLN: 8.0000 [IU] | Freq: Three times a day (TID) | INTRAMUSCULAR | Status: AC

## 2013-09-20 MED ORDER — LOPERAMIDE HCL 2 MG PO CAPS
2.0000 mg | ORAL_CAPSULE | ORAL | Status: DC | PRN
  Administered 2013-09-20: 2 mg via ORAL
  Filled 2013-09-20: qty 1

## 2013-09-20 MED ORDER — AMITRIPTYLINE HCL 25 MG PO TABS: 25.0000 mg | ORAL_TABLET | Freq: Every day | ORAL | Status: AC

## 2013-09-20 MED ORDER — LISINOPRIL 20 MG PO TABS: 20.0000 mg | ORAL_TABLET | Freq: Every day | ORAL | Status: AC

## 2013-09-20 MED ORDER — INSULIN GLARGINE 100 UNIT/ML ~~LOC~~ SOLN: 60.0000 [IU] | Freq: Every day | SUBCUTANEOUS | Status: AC

## 2013-09-20 MED ORDER — QUETIAPINE FUMARATE 400 MG PO TABS: 400.0000 mg | ORAL_TABLET | Freq: Every day | ORAL | Status: AC

## 2013-09-20 MED ORDER — HYDROCHLOROTHIAZIDE 25 MG PO TABS: 25.0000 mg | ORAL_TABLET | Freq: Every day | ORAL | Status: AC

## 2013-09-20 NOTE — Discharge Summary (Signed)
Physician Discharge Summary Note  Patient:  Henry Conner is an 63 y.o., male MRN:  [id number] DOB:  19-Nov-1950 Patient phone:  [phone] (home)  Patient address:   [address] [place] [address],  Total Time spent with patient: Greater than 30 mintues  Date of Admission:  09/14/2013 Date of Discharge: 09/20/13  Reason for Admission: Opioid detox  Discharge Diagnoses: Active Problems:   Opiate dependence   Psychiatric Specialty Exam: Physical Exam  Psychiatric: His speech is normal and behavior is normal. Judgment and thought content normal. His mood appears not anxious. His affect is not angry, not blunt, not labile and not inappropriate. Cognition and memory are normal. He does not exhibit a depressed mood.    Review of Systems  Constitutional: Negative.   HENT: Negative.   Eyes: Negative.   Respiratory: Negative.   Cardiovascular: Negative.   Gastrointestinal: Negative.   Genitourinary: Negative.   Musculoskeletal: Negative.   Skin: Negative.   Neurological: Negative.   Endo/Heme/Allergies: Negative.   Psychiatric/Behavioral: Positive for depression (Stable) and substance abuse (Opioid dependence). Negative for suicidal ideas, hallucinations and memory loss. The patient has insomnia (Stable). The patient is not nervous/anxious.     Blood pressure 106/74, pulse 57, temperature 98.1 F (36.7 C), temperature source Oral, resp. rate 16, height  (1.88 m), weight 86.183 kg (190 lb), SpO2 100.00%.Body mass index is 24.38 kg/(m^2).   General Appearance: Well Groomed   Patent attorney:: Good   Speech: Normal Rate   Volume: Decreased   Mood: states he is feeling better and denies depression at this time   Affect: Appropriate- still somewhat constricted   Thought Process: Goal Directed and Linear   Orientation: Other: fully alert and attentive   Thought Content: no hallucinations, no delusions   Suicidal Thoughts: No- denies any suicidal or homicidal ideations    Homicidal Thoughts: No   Memory: NA   Judgement: Good   Insight: Fair   Psychomotor Activity: Normal   Concentration: Good   Recall: Good   Fund of Knowledge:Good   Language: Good   Akathisia: Negative   Handed: Right   AIMS (if indicated):   Assets: Desire for Improvement  Resilience  Social Support   Sleep: Number of Hours: 4.75    Past Psychiatric History: Diagnosis: Opioid dependence  Hospitalizations: [facility] adult unit  Outpatient Care: [facility] in [place], [place]  Substance Abuse Care: [facility] in [place], [place]  Self-Mutilation: [place]  Suicidal Attempts: NA  Violent Behaviors: NA   Musculoskeletal: Strength & Muscle Tone: within normal limits Gait & Station: normal Patient leans: N/A  DSM5: Schizophrenia Disorders:  NA Obsessive-Compulsive Disorders:  NA Trauma-Stressor Disorders:  NA Substance/Addictive Disorders:  Opioid Disorder - Severe (304.00) Depressive Disorders:  NA  Axis Diagnosis:  AXIS I:  Opioid dependence AXIS II:  Deferred AXIS III:   Past Medical History  Diagnosis Date  . Diabetes mellitus without complication   . Hypertension   . PTSD (post-traumatic stress disorder)   . Opiate abuse, continuous   . Cannabis abuse   . Bipolar disorder   . Hypertriglyceridemia   . DDD (degenerative disc disease)   . Chronic pain   . Insomnia    AXIS IV:  other psychosocial or environmental problems and drug dependence AXIS V:  62  Level of Care:  OP  Hospital Course:  63 year old man, who reports recent use of heroin ( " skin popping"). Reports a long history  of being on opiates for back pain, but states that over the last year or two his opiate use progressed to heroin. Recently was using about two bags per day. States that he became " sick and tired of going through withdrawals all the time" so decided to seek treatment.  Henry Conner was admitted to the hospital for opioid detox. His UDS test reports upon admission  were positive for opioid and THC. He admitted having been using heroin. He received clonidine detox protocols for opioid detox. He was also enrolled and participated in the group sessions and AA/NA meetings being offered and held on this unit. He learned coping skills.  Besides the detox treatment, Henry Conner was also medicated and discharged on Amitriptyline 25 mg Q bedtime for depression/insomnia, Sertraline 100 mg daily for depression and Seroquel 500 mg q bedtime for mood control. He was resumed on his pertinent home medications for his other pre-existing medical issues. He tolerated his treatment regimen without any significant adverse effects and or reactions.  Henry Conner has completed detox treatments. He is currently being discharged to continue psychiatric treatment at the [facility] in [place], [place]. Upon discharge, he adamantly denies any SIHI, AVH, delusional thoughts and or withdrawal symptoms. He was provided with a 14 days worth, supply samples of his [facility] discharge medications. He left [facility] with all personal belongings in no distress. Transportation per patient's arrangements.  Consults:  psychiatry  Significant Diagnostic Studies:  labs: CBC with diff, CMP, UDS, toxicology tests, U/A  Discharge Vitals:   Blood pressure 106/74, pulse 57, temperature 98.1 F (36.7 C), temperature source Oral, resp. rate 16, height  (1.88 m), weight 86.183 kg (190 lb), SpO2 100.00%. Body mass index is 24.38 kg/(m^2). Lab Results:   Results for orders placed during the hospital encounter of 09/14/13 (from the past 72 hour(s))  GLUCOSE, CAPILLARY     Status: Abnormal   Collection Time    09/17/13  5:12 PM      Result Value Ref Range   Glucose-Capillary 114 (*) 70 - 99 mg/dL  GLUCOSE, CAPILLARY     Status: Abnormal   Collection Time    09/17/13  8:46 PM      Result Value Ref Range   Glucose-Capillary 119 (*) 70 - 99 mg/dL  GLUCOSE, CAPILLARY     Status: Abnormal   Collection Time     09/18/13  6:14 AM      Result Value Ref Range   Glucose-Capillary 107 (*) 70 - 99 mg/dL  GLUCOSE, CAPILLARY     Status: None   Collection Time    09/18/13 12:10 PM      Result Value Ref Range   Glucose-Capillary 93  70 - 99 mg/dL   Comment 1 Notify RN    GLUCOSE, CAPILLARY     Status: None   Collection Time    09/18/13  5:15 PM      Result Value Ref Range   Glucose-Capillary 98  70 - 99 mg/dL  GLUCOSE, CAPILLARY     Status: None   Collection Time    09/18/13  9:23 PM      Result Value Ref Range   Glucose-Capillary 95  70 - 99 mg/dL  GLUCOSE, CAPILLARY     Status: None   Collection Time    09/19/13  6:24 AM      Result Value Ref Range   Glucose-Capillary 82  70 - 99 mg/dL   Comment 1 Notify RN    GLUCOSE, CAPILLARY  Status: None   Collection Time    09/19/13 12:09 PM      Result Value Ref Range   Glucose-Capillary 97  70 - 99 mg/dL   Comment 1 STAT Lab     Comment 2 Notify RN    GLUCOSE, CAPILLARY     Status: Abnormal   Collection Time    09/19/13  5:07 PM      Result Value Ref Range   Glucose-Capillary 147 (*) 70 - 99 mg/dL   Comment 1 Notify RN    GLUCOSE, CAPILLARY     Status: Abnormal   Collection Time    09/19/13  8:55 PM      Result Value Ref Range   Glucose-Capillary 115 (*) 70 - 99 mg/dL  GLUCOSE, CAPILLARY     Status: Abnormal   Collection Time    09/20/13  2:57 AM      Result Value Ref Range   Glucose-Capillary 129 (*) 70 - 99 mg/dL  GLUCOSE, CAPILLARY     Status: Abnormal   Collection Time    09/20/13  5:59 AM      Result Value Ref Range   Glucose-Capillary 141 (*) 70 - 99 mg/dL  GLUCOSE, CAPILLARY     Status: Abnormal   Collection Time    09/20/13 11:28 AM      Result Value Ref Range   Glucose-Capillary 117 (*) 70 - 99 mg/dL    Physical Findings: AIMS: Facial and Oral Movements Muscles of Facial Expression: None, normal Lips and Perioral Area: None, normal Jaw: None, normal Tongue: None, normal,Extremity Movements Upper (arms, wrists,  hands, fingers): None, normal Lower (legs, knees, ankles, toes): None, normal, Trunk Movements Neck, shoulders, hips: None, normal, Overall Severity Severity of abnormal movements (highest score from questions above): None, normal Incapacitation due to abnormal movements: None, normal Patient's awareness of abnormal movements (rate only patient's report): No Awareness, Dental Status Current problems with teeth and/or dentures?: No Does patient usually wear dentures?: No  CIWA:    COWS:  COWS Total Score: 5  Psychiatric Specialty Exam: See Psychiatric Specialty Exam and Suicide Risk Assessment completed by Attending Physician prior to discharge.  Discharge destination:  Home  Is patient on multiple antipsychotic therapies at discharge:  No   Has Patient had three or more failed trials of antipsychotic monotherapy by history:  No  Recommended Plan for Multiple Antipsychotic Therapies: NA     Medication List       Indication   amitriptyline 25 MG tablet  Commonly known as:  ELAVIL  Take 1 tablet (25 mg total) by mouth at bedtime. Depression/sleep   Indication:  Depression, Trouble Sleeping     hydrochlorothiazide 25 MG tablet  Commonly known as:  HYDRODIURIL  Take 1 tablet (25 mg total) by mouth daily. For high blood pressure   Indication:  High Blood Pressure     insulin glargine 100 UNIT/ML injection  Commonly known as:  LANTUS  Inject 0.6 mLs (60 Units total) into the skin at bedtime. For diabetes management   Indication:  Type 2 Diabetes     insulin regular 100 units/mL injection  Commonly known as:  NOVOLIN R,HUMULIN R  Inject 0.08 mLs (8 Units total) into the skin 3 (three) times daily before meals. For diabetes management   Indication:  Type 2 Diabetes     lisinopril 20 MG tablet  Commonly known as:  PRINIVIL,ZESTRIL  Take 1 tablet (20 mg total) by mouth daily. For high blood pressure  Indication:  High Blood Pressure     QUEtiapine 400 MG tablet  Commonly  known as:  SEROQUEL  Take 1 tablet (400 mg total) by mouth at bedtime. For mood control   Indication:  Depressive Phase of Manic-Depression, Mood control     sertraline 100 MG tablet  Commonly known as:  ZOLOFT  Take 2 tablets (200 mg total) by mouth daily. For depression   Indication:  Major Depressive Disorder, Posttraumatic Stress Disorder       Follow-up Information   Follow up with [facility]. (Please follow up with [facility] Monday-Friday from 8:30 am - 5 pm for walk-in assessment for therapy and medication management. Please bring your insurance card and I.D. Sliding scale fee available. )    Contact information:   [address] [place], [place] [address] Telephone: [phone] 0r [phone]      Follow-up recommendations: Activity:  As tolerated Diet: As recommended by your primary care doctor. Keep all scheduled follow-up appointments as recommended.    Comments:  Take all your medications as prescribed by your mental healthcare provider. Report any adverse effects and or reactions from your medicines to your outpatient provider promptly. Patient is instructed and cautioned to not engage in alcohol and or illegal drug use while on prescription medicines. In the event of worsening symptoms, patient is instructed to call the crisis hotline, 911 and or go to the nearest ED for appropriate evaluation and treatment of symptoms. Follow-up with your primary care provider for your other medical issues, concerns and or health care needs.   Total Discharge Time:  Greater than 30 minutes.  Signed: Sanjuana Kava, PMHNP-BC 09/20/2013, 3:54 PM  Patient seen, Suicide Assessment Completed.  Disposition Plan Reviewed
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
## 2013-09-20 NOTE — Progress Notes (Signed)
Patient ID: Henry Conner, male   DOB: [birth year], 63 y.o.   MRN: [id number] Patient was discharged ambulatory to ride home with son's mother to [facility].  He denies SI/HI.  He verbalizes understanding of his discharge meds and followup.  He was given scripts and a supply of meds by MD.  He  Verbalizes understanding of the  change in insulin coverage at mealtimes while here.  He is eating less and meal coverage has ben 4 units instead of 8.  He is planning to see his doctor about his back pain.

## 2013-09-20 NOTE — Plan of Care (Signed)
Problem: Ineffective individual coping Goal: STG: Patient will remain free from self harm Outcome: Progressing Pt has remained free from self harm this shift.    Problem: Ineffective individual coping Goal: STG: Patient will remain free from self harm Outcome: Progressing Pt has not harmed self this shift.    Problem: Alteration in mood Goal: LTG-Patient reports reduction in suicidal thoughts (Patient reports reduction in suicidal thoughts and is able to verbalize a safety plan for whenever patient is feeling suicidal)  Outcome: Progressing Pt denies SI this shift.   Goal: STG-Patient reports thoughts of self-harm to staff Outcome: Progressing Pt agreed to notify staff if thinking of harming self.  Problem: Alteration in mood & ability to function due to Goal: LTG-Pt reports reduction in suicidal thoughts (Patient reports reduction in suicidal thoughts and is able to verbalize a safety plan for whenever patient is feeling suicidal)  Outcome: Progressing Pt denies SI this shift.   Goal: STG-Patient will attend groups Outcome: Not Progressing Pt did not attend group this shift.   Goal: STG-Patient will comply with prescribed medication regimen (Patient will comply with prescribed medication regimen)  Outcome: Progressing Pt has been compliant with medications this shift.

## 2013-09-20 NOTE — Progress Notes (Signed)
NSG shift assessment. 7a-7p.  D: Affect blunted, mood depressed, behavior guarded and isolative.  Not attending groups; stays in bed. Complains of frequent chronic back pain and states that is the reason he does not get out of bed. Also complains of diarrhea and said that he soiled his bed last night. Cooperative with staff and is getting along well with peers. 11:21 AM Pt got out of bed and went to the Day Room to have coffee. His gait is stiff, but steady and slow. 12:23 PM Said that he is not going to eat lunch. Insulin not given until sure if he is going to eat or not and reported to oncoming nurse at 12:00  A: Observed pt interacting in group and in the milieu: Support and encouragement offered. Safety maintained with observations every 15 minutes.   R: Contracts for safety - not vested in working himself.

## 2013-09-20 NOTE — Progress Notes (Addendum)
D: Pt presents with flat affect, guarded mood.  Pt denies SI/HI.  Denies AH/VH.  Pt reports "my day wasn't too good because I was in pain, they had to give me a shot for it earlier." A: Medications administered per MD order.  Encouragement and support offered.  Safety maintained. R: Pt was isolative with minimal interactions.  Pt was in his room the entire shift so far.  Pt verbally contracts for safety.  Pt is in no acute distress at this time.  Will continue to monitor and assess for safety.

## 2013-09-20 NOTE — [facility] Group Notes (Signed)
[facility] LCSW Group Therapy 09/20/2013  1:15 PM   Type of Therapy: Group Therapy  Participation Level: Did Not Attend.   Laiyah Exline, MSW, LCSWA Clinical Social Worker [facility] [phone]   

## 2013-09-20 NOTE — Progress Notes (Signed)
[facility] Adult Case Management Discharge Plan :  Will you be returning to the same living situation after discharge: Yes,  patient will be returning home with family. At discharge, do you have transportation home?:Yes,  patient reports that he will have transportation home. Do you have the ability to pay for your medications:Yes,  patient will be provided with necessary medication samples and prescriptions.   Release of information consent forms completed and in the chart;  Patient's signature needed at discharge.  Patient to Follow up at: Follow-up Information   Follow up with [facility]. (Please follow up with [facility] Monday-Friday from 8:30 am - 5 pm for walk-in assessment for therapy and medication management. Please bring your insurance card and I.D. Sliding scale fee available. )    Contact information:   [address] [place], [place] [address] Telephone: [phone]       Patient denies SI/HI:   Yes,  denies    Safety Planning and Suicide Prevention discussed:  Yes,  with patient  Chima Astorino, [place] 09/20/2013, 9:35 AM

## 2013-09-20 NOTE — [facility] Group Notes (Signed)
[facility] LCSW Aftercare Discharge Planning Group Note  09/20/2013  8:45 AM  Participation Quality: Did Not Attend.  Samuella Bruin, MSW, [employer] Clinical Social Worker [facility] [phone]

## 2013-09-23 NOTE — Progress Notes (Signed)
Patient Discharge Instructions:  After Visit Summary (AVS):   Faxed to:  09/23/13 Discharge Summary Note:   Faxed to:  09/23/13 Psychiatric Admission Assessment Note:   Faxed to:  09/23/13 Suicide Risk Assessment - Discharge Assessment:   Faxed to:  09/23/13 Faxed/Sent to the Next Level Care provider:  09/23/13 Faxed to [facility] @ [phone]  Jerelene Redden, 09/23/2013, 2:30 PM

## 2015-10-28 IMAGING — CR DG CHEST 2V
2 series · 2 of 2 positions shown · non-contrast
Comparison: None.

CLINICAL DATA: Chest pain and difficulty breathing

EXAM:
CHEST  2 VIEW

[w chest pa]
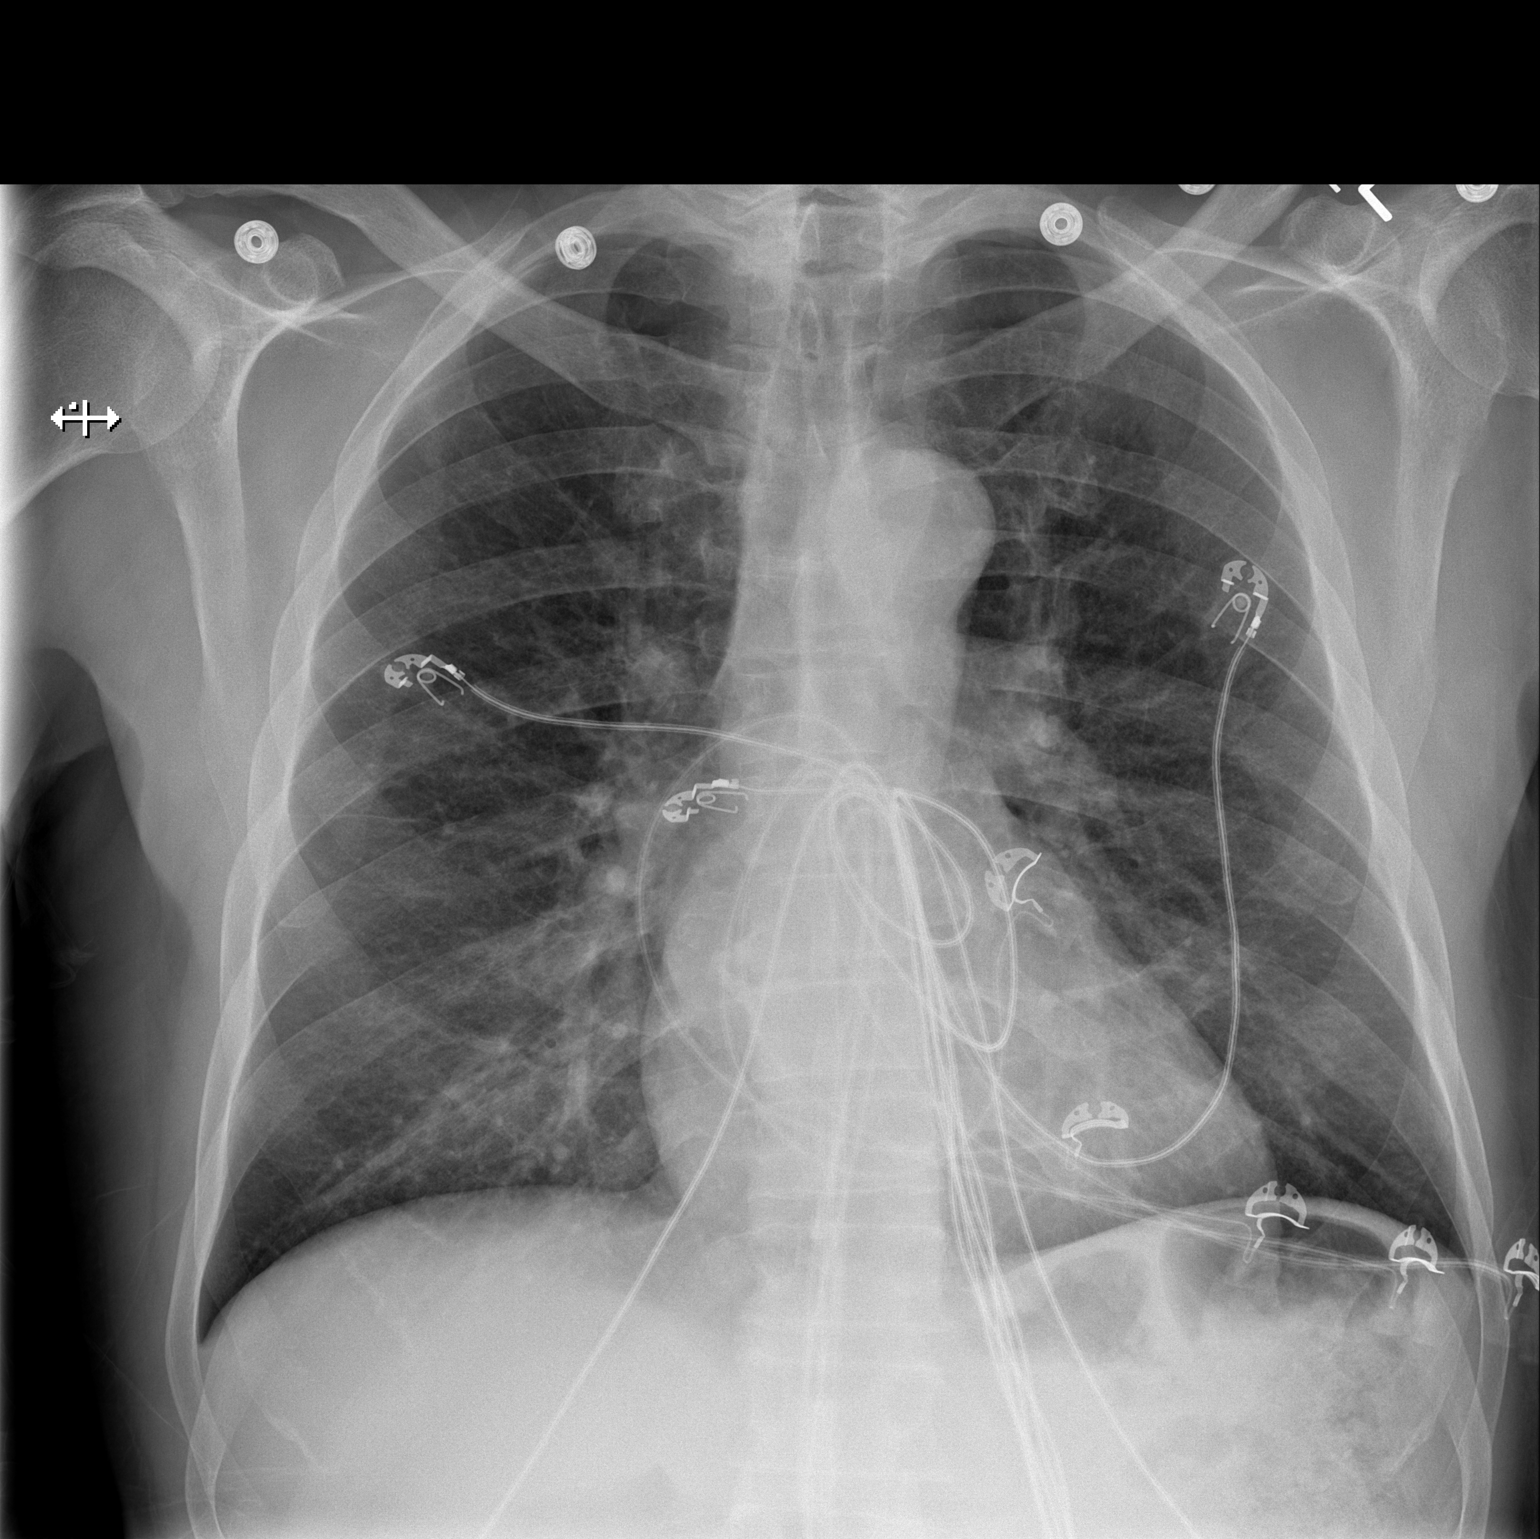

[w chest lat]
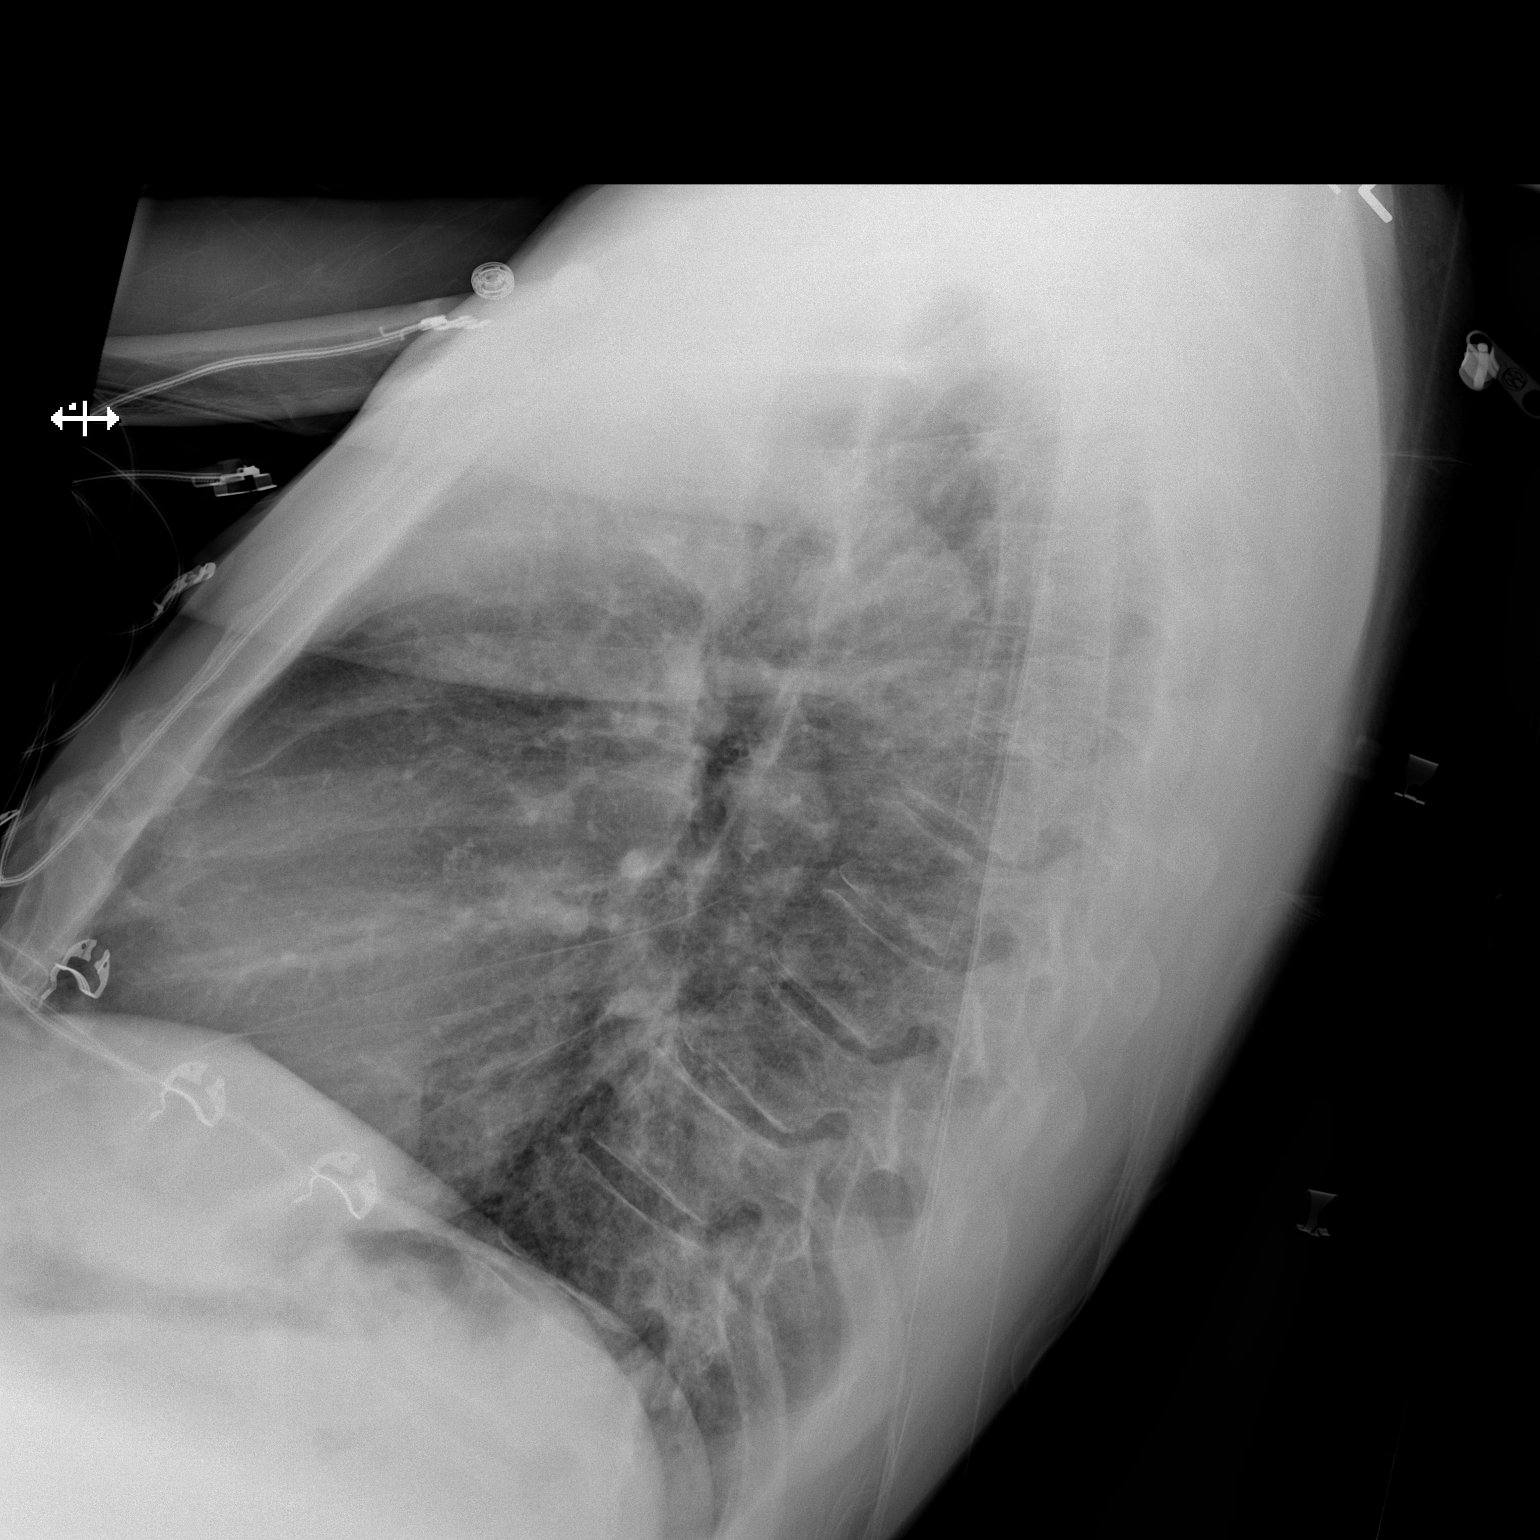

[2 of 2 positions shown; findings below may reference images not displayed]

FINDINGS: Lungs are clear. Heart size and pulmonary vascularity are normal. No
adenopathy. No pneumothorax. No bone lesions.
IMPRESSION: No edema or consolidation.

## 2015-10-28 IMAGING — US US SCROTUM
1 series · 14 of 25 positions shown · non-contrast
Comparison: None.

CLINICAL DATA: Bilateral testicular tenderness for several month

EXAM:
SCROTAL ULTRASOUND
DOPPLER ULTRASOUND OF THE TESTICLES
TECHNIQUE: Complete ultrasound examination of the testicles, epididymis, and
other scrotal structures was performed. Color and spectral Doppler
ultrasound were also utilized to evaluate blood flow to the
testicles.

[Series 1: us scrotum · 0.05mm/px · 14 of 37 slices shown]
[im 1/37]
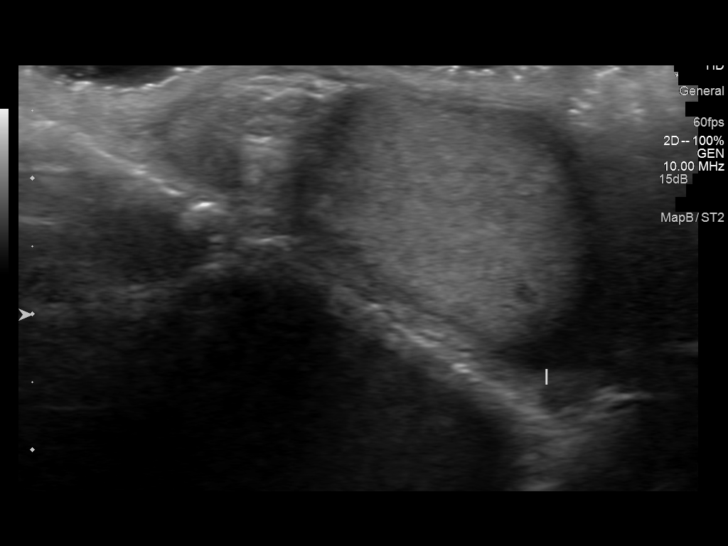
[im 4/37]
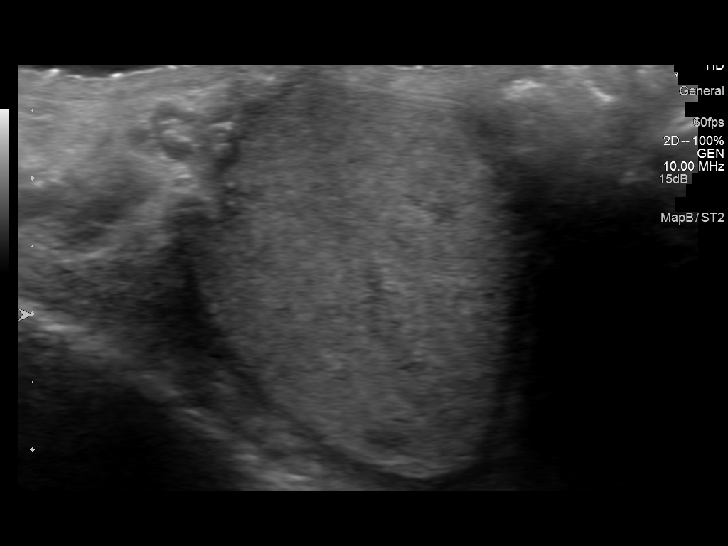
[im 7/37]
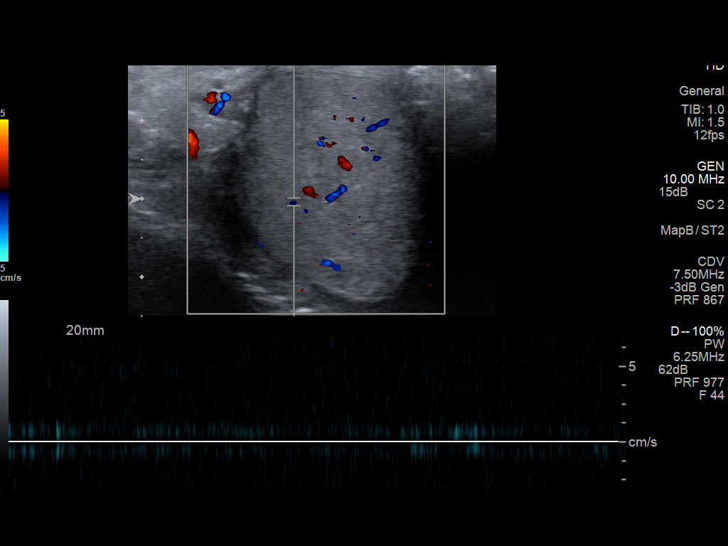
[im 10/37]
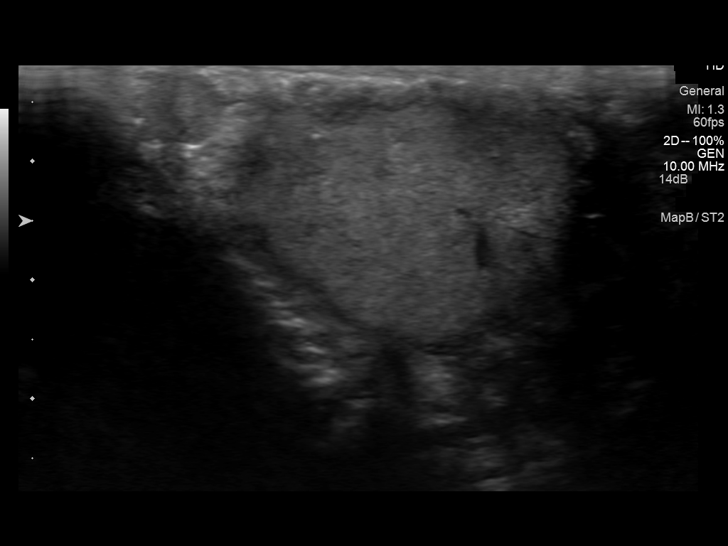
[im 13/37]
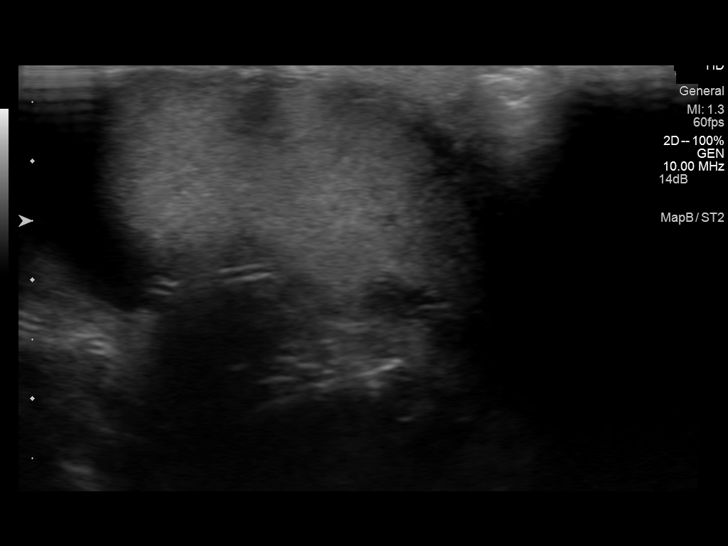
[im 14/37]
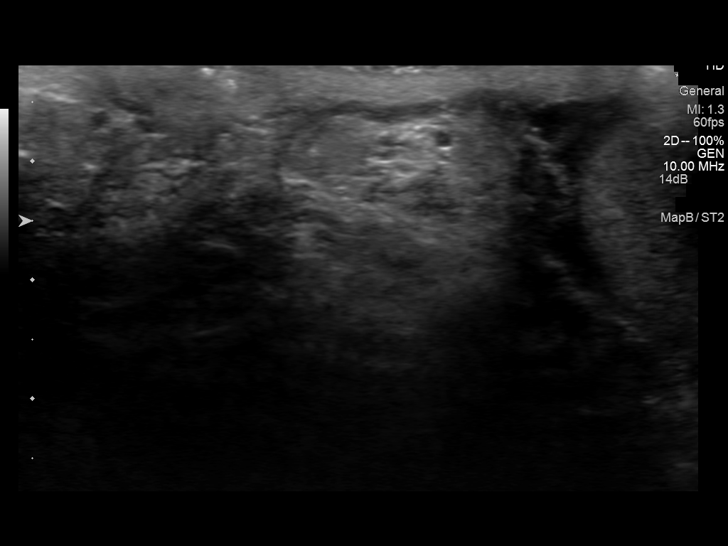
[im 17/37]
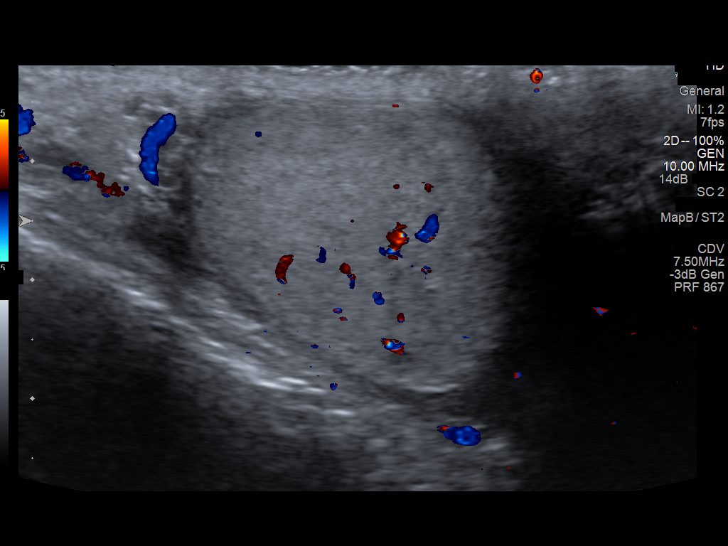
[im 20/37]
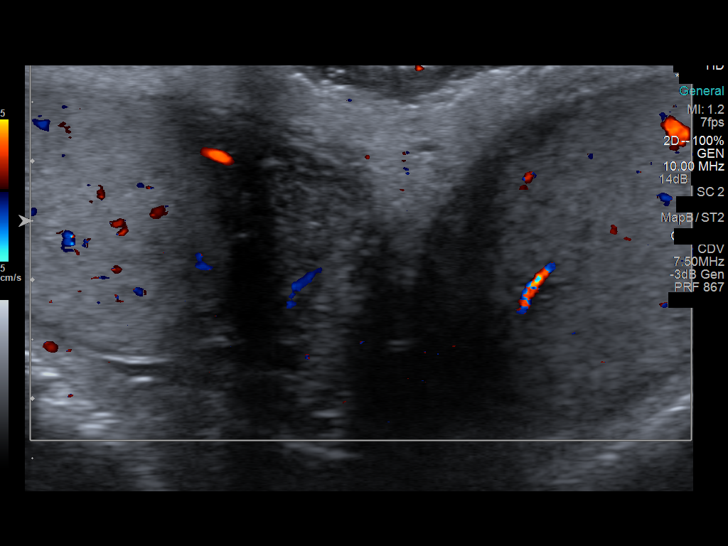
[im 23/37]
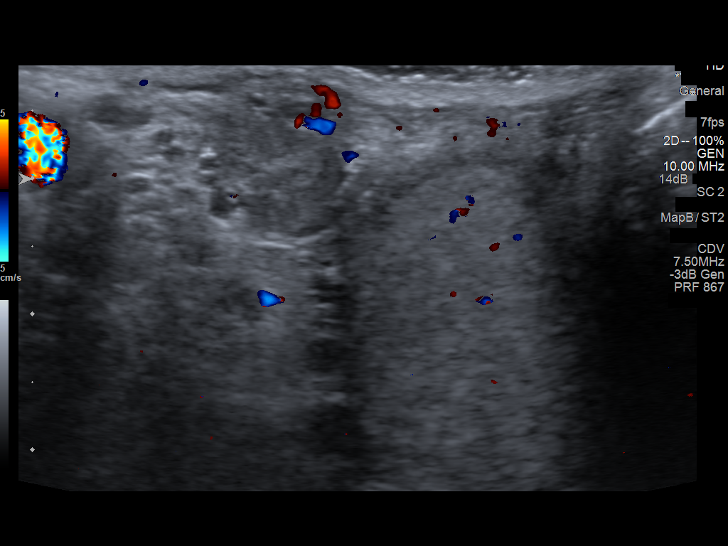
[im 25/37]
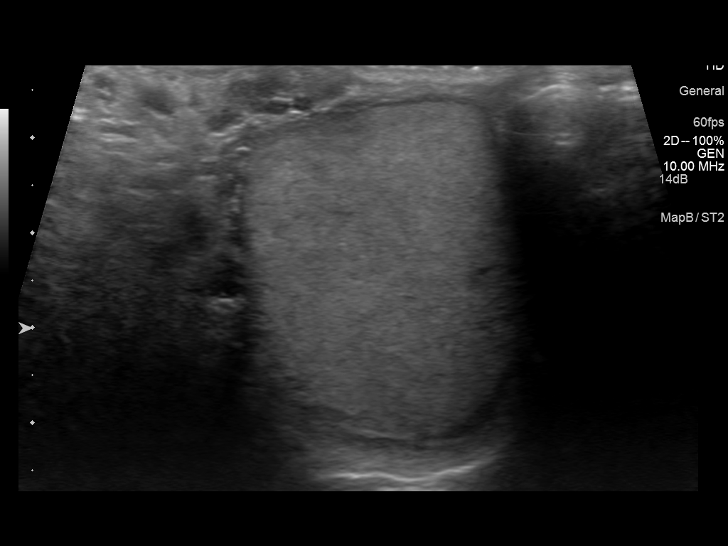
[im 28/37]
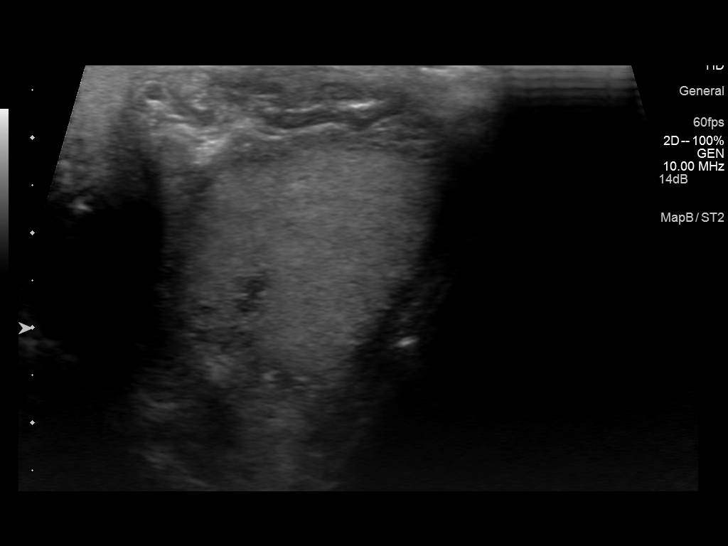
[im 31/37]
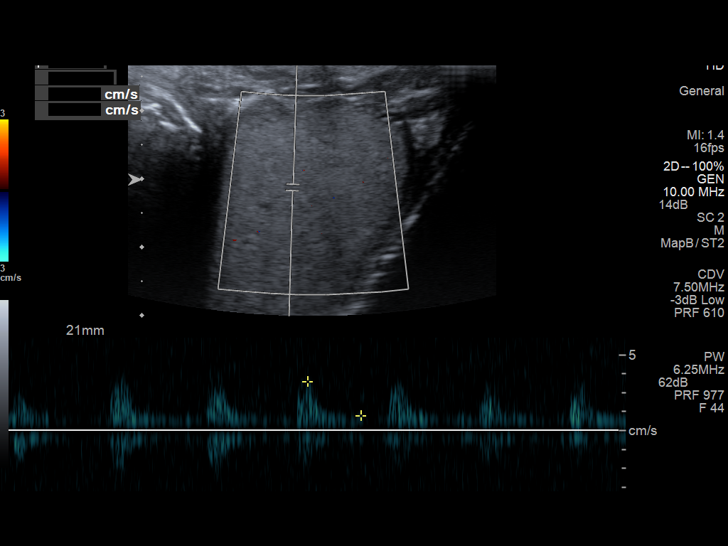
[im 34/37]
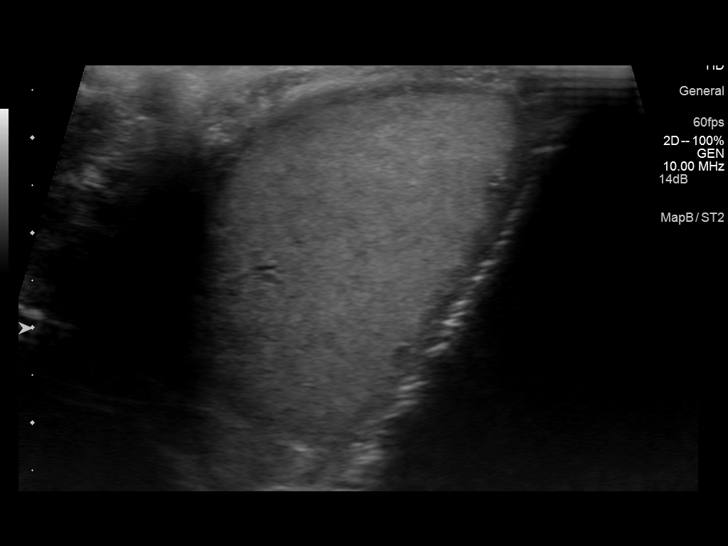
[im 37/37]
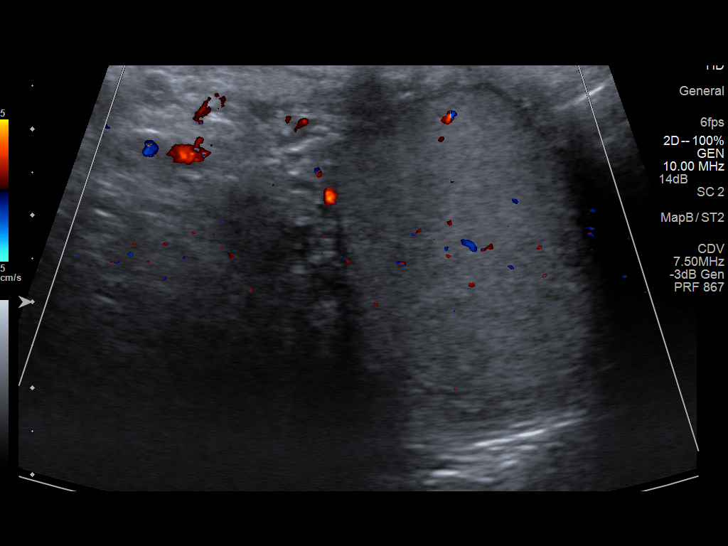

[14 of 25 positions shown; findings below may reference images not displayed]

FINDINGS: Right testicle

Measurements: 3.1 x 2.7 x 3.7 cm. No mass or microlithiasis
visualized.

Left testicle

Measurements: 3.6 x 2.9 x 3.1 cm. No mass or microlithiasis
visualized.

Right epididymis:  Normal in size and appearance.

Left epididymis:  Normal in size and appearance.

Hydrocele:  None visualized.

Varicocele: Small bilateral varicoceles are noted which are
accentuated with Valsalva.

Pulsed Doppler interrogation of both testes demonstrates low
resistance arterial and venous waveforms bilaterally.
IMPRESSION: 1. No evidence for testicular torsion or testicular mass.
2. Small bilateral varicoceles which appear slightly accentuated
with Valsalva.

## 2021-11-28 DEATH — deceased
